# Patient Record
Sex: Female | Born: 1945 | Race: White | Hispanic: No | Marital: Married | Smoking: Never smoker
Health system: Southern US, Community
[De-identification: ages and names within clinical notes are randomized; demographics above are authoritative.]

## PROBLEM LIST (undated history)

## (undated) DIAGNOSIS — E039 Hypothyroidism, unspecified: Secondary | ICD-10-CM

## (undated) DIAGNOSIS — M1990 Unspecified osteoarthritis, unspecified site: Secondary | ICD-10-CM

## (undated) HISTORY — PX: ABDOMINAL HYSTERECTOMY: SHX81

---

## 2009-04-17 ENCOUNTER — Ambulatory Visit: Payer: Self-pay | Admitting: Unknown Physician Specialty

## 2009-04-23 ENCOUNTER — Ambulatory Visit: Payer: Self-pay | Admitting: Unknown Physician Specialty

## 2019-09-09 NOTE — Discharge Instructions (Signed)
Instructions after Total Hip Replacement     Gregroy Dombkowski P. Lashawn Bromwell, Jr., M.D.     Dept. of Orthopaedics & Sports Medicine  [facility]  [address]  [place], [place]  [address]  Phone: [phone]   Fax: [phone]    DIET: . Drink plenty of non-alcoholic fluids. . Resume your normal diet. Include foods high in fiber.  ACTIVITY:  . You may use crutches or a walker with weight-bearing as tolerated, unless instructed otherwise. . You may be weaned off of the walker or crutches by your Physical Therapist.  . Do NOT reach below the level of your knees or cross your legs until allowed.    . Continue doing gentle exercises. Exercising will reduce the pain and swelling, increase motion, and prevent muscle weakness.   . Please continue to use the TED compression stockings for 6 weeks. You may remove the stockings at night, but should reapply them in the morning. . Do not drive or operate any equipment until instructed.  WOUND CARE:  . Continue to use ice packs periodically to reduce pain and swelling. . Keep the incision clean and dry. . You may bathe or shower after the staples are removed at the first office visit following surgery.  MEDICATIONS: . You may resume your regular medications. . Please take the pain medication as prescribed on the medication. . Do not take pain medication on an empty stomach. . You have been given a prescription for a blood thinner to prevent blood clots. Please take the medication as instructed. (NOTE: After completing a 2 week course of Lovenox, take one Enteric-coated aspirin once a day.) . Pain medications and iron supplements can cause constipation. Use a stool softener (Senokot or Colace) on a daily basis and a laxative (dulcolax or miralax) as needed. . Do not drive or drink alcoholic beverages when taking pain medications.  CALL THE OFFICE FOR: . Temperature above 101 degrees . Excessive bleeding or drainage on the dressing. . Excessive  swelling, coldness, or paleness of the toes. . Persistent nausea and vomiting.  FOLLOW-UP:  . You should have an appointment to return to the office in 6 weeks after surgery. . Arrangements have been made for continuation of Physical Therapy (either home therapy or outpatient therapy).     [facility] Department Directory         [email or web address]       [email or web address]          Cardiology  Appointments: [place] - [phone] [place] - [phone]  Endocrinology  Appointments: [place] - [phone] [place] - [phone]  Gastroenterology  Appointments: [place] - [phone] [place] - [phone]        General Surgery   Appointments: [place] - [phone]  Internal Medicine/Family Medicine  Appointments: [place] - [phone] [place] - [phone] [place] - [phone]  Metabolic and Weigh Loss Surgery  Appointments: [place] - [phone]        Neurology  Appointments: [place] - [phone] [place] - [phone]  Neurosurgery  Appointments: [place] - [phone]  Obstetrics & Gynecology  Appointments: [place] - [phone] [place] - [phone]        Pediatrics  Appointments: [place] - [phone] [place] - [phone]  Physiatry  Appointments: [place] -[phone]  Physical Therapy  Appointments: [place] - [phone] [place] - [phone]        Podiatry  Appointments: [place] - [phone] [place] - [phone]  Pulmonology  Appointments: [place] - [phone]  Rheumatology  Appointments:  - [phone]         Location: [facility]  [address] [place], [place]  [address]  [place] Location: [facility] [address] [place], [place]  [address]  [place] Location: [facility] [address] [place], [place]  [address]    

## 2019-09-21 ENCOUNTER — Encounter
Admission: RE | Admit: 2019-09-21 | Discharge: 2019-09-21 | Disposition: A | Discharge status: Home / Self Care | Payer: Medicare Other | Source: Ambulatory Visit | Attending: Orthopedic Surgery | Admitting: Orthopedic Surgery

## 2019-09-21 ENCOUNTER — Encounter: Payer: Self-pay | Admitting: *Deleted

## 2019-09-21 ENCOUNTER — Other Ambulatory Visit: Payer: Self-pay

## 2019-09-21 DIAGNOSIS — Z01818 Encounter for other preprocedural examination: Secondary | ICD-10-CM | POA: Insufficient documentation

## 2019-09-21 DIAGNOSIS — Z01812 Encounter for preprocedural laboratory examination: Secondary | ICD-10-CM | POA: Insufficient documentation

## 2019-09-21 HISTORY — DX: Hypothyroidism, unspecified: E03.9

## 2019-09-21 HISTORY — DX: Unspecified osteoarthritis, unspecified site: M19.90

## 2019-09-21 LAB — COMPREHENSIVE METABOLIC PANEL
ALT: 13 U/L (ref 0–44)
AST: 17 U/L (ref 15–41)
Albumin: 3.9 g/dL (ref 3.5–5.0)
Alkaline Phosphatase: 39 U/L (ref 38–126)
Anion gap: 10 (ref 5–15)
BUN: 23 mg/dL (ref 8–23)
CO2: 30 mmol/L (ref 22–32)
Calcium: 9.3 mg/dL (ref 8.9–10.3)
Chloride: 99 mmol/L (ref 98–111)
Creatinine, Ser: 0.79 mg/dL (ref 0.44–1.00)
GFR calc Af Amer: >60 mL/min (ref >60)
GFR calc non Af Amer: >60 mL/min (ref >60)
Glucose, Bld: 95 mg/dL (ref 70–99)
Potassium: 3.2 mmol/L — ABNORMAL LOW (ref 3.5–5.1)
Sodium: 139 mmol/L (ref 135–145)
Total Bilirubin: 0.5 mg/dL (ref 0.3–1.2)
Total Protein: 7 g/dL (ref 6.5–8.1)

## 2019-09-21 LAB — C-REACTIVE PROTEIN: CRP: 0.5 mg/dL (ref <1.0)

## 2019-09-21 LAB — CBC
HCT: 29.9 % — ABNORMAL LOW (ref 36.0–46.0)
Hemoglobin: 9.8 g/dL — ABNORMAL LOW (ref 12.0–15.0)
MCH: 29.4 pg (ref 26.0–34.0)
MCHC: 32.8 g/dL (ref 30.0–36.0)
MCV: 89.8 fL (ref 80.0–100.0)
Platelets: 259 10*3/uL (ref 150–400)
RBC: 3.33 MIL/uL — ABNORMAL LOW (ref 3.87–5.11)
RDW: 13.3 % (ref 11.5–15.5)
WBC: 5.5 10*3/uL (ref 4.0–10.5)
nRBC: 0 % (ref 0.0–0.2)

## 2019-09-21 LAB — APTT: aPTT: 29 seconds (ref 24–36)

## 2019-09-21 LAB — URINALYSIS, ROUTINE W REFLEX MICROSCOPIC
Bilirubin Urine: NEGATIVE
Glucose, UA: NEGATIVE mg/dL
Ketones, ur: NEGATIVE mg/dL
Nitrite: POSITIVE — AB
Protein, ur: NEGATIVE mg/dL
Specific Gravity, Urine: 1.011 (ref 1.005–1.030)
WBC, UA: >50 WBC/hpf — ABNORMAL HIGH (ref 0–5)
pH: 6 (ref 5.0–8.0)

## 2019-09-21 LAB — PROTIME-INR
INR: 1 (ref 0.8–1.2)
Prothrombin Time: 13 seconds (ref 11.4–15.2)

## 2019-09-21 LAB — TYPE AND SCREEN
ABO/RH(D): A POS
Antibody Screen: NEGATIVE

## 2019-09-21 LAB — SEDIMENTATION RATE: Sed Rate: 28 mm/hr (ref 0–30)

## 2019-09-21 LAB — SURGICAL PCR SCREEN
MRSA, PCR: POSITIVE — AB
Staphylococcus aureus: POSITIVE — AB

## 2019-09-21 NOTE — Pre-Procedure Instructions (Addendum)
EKG from today compared to EKG from 04/12/19 is relatively unremarkable.   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 SINUS TACHYCARDIA POSSIBLE LEFT ATRIAL ENLARGEMENT  INCOMPLETE RIGHT BUNDLE BRANCH BLOCK LEFT ANTERIOR FASCICULAR BLOCK SEPTAL INFARCT  , AGE UNDETERMINED  Confirmed by Eldred Manges ([id number]) on 04/12/2019 7:36:21 AM

## 2019-09-21 NOTE — Patient Instructions (Signed)
Your procedure is scheduled on: Wed. 6/16 Report to Day Surgery. [facility] To find out your arrival time please call [phone] between 1PM - 3PM on Tues 6/15.  Remember: Instructions that are not followed completely may result in serious medical risk,  up to and including death, or upon the discretion of your surgeon and anesthesiologist your  surgery may need to be rescheduled.     _X__ 1. Do not eat food after midnight the night before your procedure.                 No gum chewing or hard candies. You may drink clear liquids up to 2 hours                 before you are scheduled to arrive for your surgery- DO not drink clear                 liquids within 2 hours of the start of your surgery.                 Clear Liquids include:  water, apple juice without pulp, clear Gatorade, G2 or                  Gatorade Zero (avoid Red/Purple/Blue), Black Coffee or Tea (Do not add                 anything to coffee or tea). __x___2.   Complete the carbohydrate drink provided to you, 2 hours before arrival.  __X__2.  On the morning of surgery brush your teeth with toothpaste and water, you                may rinse your mouth with mouthwash if you wish.  Do not swallow any toothpaste of mouthwash.     ___ 3.  No Alcohol for 24 hours before or after surgery.   ___ 4.  Do Not Smoke or use e-cigarettes For 24 Hours Prior to Your Surgery.                 Do not use any chewable tobacco products for at least 6 hours prior to                 Surgery.  __  5.  Do not use any recreational drugs (marijuana, cocaine, heroin, ecstacy, MDMA or other)                For at least one week prior to your surgery.  Combination of these drugs with anesthesia                May have life threatening results.  ____  6.  Bring all medications with you on the day of surgery if instructed.   _x___  7.  Notify your doctor if there is any change in your medical condition      (cold,  fever, infections).     Do not wear jewelry, make-up, hairpins, clips or nail polish. Do not wear lotions, powders, or perfumes. You may wear deodorant. Do not shave 48 hours prior to surgery.  Do not bring valuables to the hospital.    [facility] is not responsible for any belongings or valuables.  Contacts, dentures or bridgework may not be worn into surgery. Leave your suitcase in the car. After surgery it may be brought to your room. For patients admitted to the hospital, discharge time is determined by your treatment team.  Patients discharged the day of surgery will not be allowed to drive home.   Make arrangements for someone to be with you for the first 24 hours of your Same Day Discharge.    Please read over the following fact sheets that you were given:    __x__ Take these medicines the morning of surgery with A SIP OF WATER:    1. levothyroxine (SYNTHROID) 50 MCG tablet  2.   3.   4.  5.  6.  ____ Fleet Enema (as directed)   __x__ Use CHG Soap (or wipes) as directed  ____ Use Benzoyl Peroxide Gel as instructed  ____ Use inhalers on the day of surgery  ____ Stop metformin 2 days prior to surgery    ____ Take 1/2 of usual insulin dose the night before surgery. No insulin the morning          of surgery.   _x___ Stop aspirin  aspirin 81 MG EC tablet on 6/9  _x___ Stop Anti-inflammatories  meloxicam (MOBIC) 7.5 MG tablet ibuprofen and aleve on 6/9    May take tylenol   ____ Stop supplements until after surgery.    ____ Bring C-Pap to the hospital.

## 2019-09-21 NOTE — Pre-Procedure Instructions (Signed)
Pre-Admit Testing Provider Notification Note  Provider Notified: Dr. Hooten  Notification Mode: Fax  Reason: Abnormal lab result.  Response: Fax confirmation received.   Additional Information: Placed on chart. Noted on Pre-Admit Worksheet.  Signed: Aniko Finnigan, RN  

## 2019-09-23 LAB — URINE CULTURE: Culture: >=100000 — AB

## 2019-09-24 NOTE — Pre-Procedure Instructions (Signed)
Pre-Admit Testing Provider Notification Note  Provider Notified: Dr. Ernest Pine  Notification Mode: Fax  Reason: Abnormal lab result. (Urine Culture)  Response: Fax confirmation received.   Additional Information: Placed on chart. Noted on Pre-Admit Worksheet.  Signed: Alvester Morin, RN

## 2019-10-01 ENCOUNTER — Other Ambulatory Visit
Admission: RE | Admit: 2019-10-01 | Discharge: 2019-10-01 | Disposition: A | Discharge status: Home / Self Care | Payer: Medicare Other | Source: Ambulatory Visit | Attending: Orthopedic Surgery | Admitting: Orthopedic Surgery

## 2019-10-01 ENCOUNTER — Other Ambulatory Visit: Payer: Self-pay

## 2019-10-02 LAB — SARS CORONAVIRUS 2 (TAT 6-24 HRS): SARS Coronavirus 2: NEGATIVE

## 2019-10-03 ENCOUNTER — Encounter: Payer: Self-pay | Admitting: Orthopedic Surgery

## 2019-10-03 ENCOUNTER — Inpatient Hospital Stay: Payer: Medicare Other

## 2019-10-03 ENCOUNTER — Inpatient Hospital Stay: Payer: Medicare Other | Admitting: Anesthesiology

## 2019-10-03 ENCOUNTER — Inpatient Hospital Stay
Admission: RE | Admit: 2019-10-03 | Discharge: 2019-10-05 | DRG: 470 | Disposition: A | Discharge status: Home Health | Payer: Medicare Other | Attending: Orthopedic Surgery | Admitting: Orthopedic Surgery

## 2019-10-03 ENCOUNTER — Other Ambulatory Visit: Payer: Self-pay

## 2019-10-03 ENCOUNTER — Encounter
Admission: RE | Disposition: A | Discharge status: Home / Self Care | Payer: Self-pay | Source: Home / Self Care | Attending: Orthopedic Surgery

## 2019-10-03 DIAGNOSIS — Z7983 Long term (current) use of bisphosphonates: Secondary | ICD-10-CM | POA: Diagnosis not present

## 2019-10-03 DIAGNOSIS — M1611 Unilateral primary osteoarthritis, right hip: Secondary | ICD-10-CM | POA: Diagnosis present

## 2019-10-03 DIAGNOSIS — Z96641 Presence of right artificial hip joint: Secondary | ICD-10-CM

## 2019-10-03 DIAGNOSIS — Z79890 Hormone replacement therapy (postmenopausal): Secondary | ICD-10-CM

## 2019-10-03 DIAGNOSIS — Z882 Allergy status to sulfonamides status: Secondary | ICD-10-CM | POA: Diagnosis not present

## 2019-10-03 DIAGNOSIS — Z881 Allergy status to other antibiotic agents status: Secondary | ICD-10-CM | POA: Diagnosis not present

## 2019-10-03 DIAGNOSIS — Z820 Family history of epilepsy and other diseases of the nervous system: Secondary | ICD-10-CM | POA: Diagnosis not present

## 2019-10-03 DIAGNOSIS — Z808 Family history of malignant neoplasm of other organs or systems: Secondary | ICD-10-CM

## 2019-10-03 DIAGNOSIS — Z7982 Long term (current) use of aspirin: Secondary | ICD-10-CM | POA: Diagnosis not present

## 2019-10-03 DIAGNOSIS — Z20822 Contact with and (suspected) exposure to covid-19: Secondary | ICD-10-CM | POA: Diagnosis present

## 2019-10-03 DIAGNOSIS — E039 Hypothyroidism, unspecified: Secondary | ICD-10-CM | POA: Diagnosis present

## 2019-10-03 DIAGNOSIS — Z791 Long term (current) use of non-steroidal anti-inflammatories (NSAID): Secondary | ICD-10-CM | POA: Diagnosis not present

## 2019-10-03 DIAGNOSIS — Z79899 Other long term (current) drug therapy: Secondary | ICD-10-CM

## 2019-10-03 DIAGNOSIS — Z96649 Presence of unspecified artificial hip joint: Secondary | ICD-10-CM

## 2019-10-03 HISTORY — PX: TOTAL HIP ARTHROPLASTY: SHX124

## 2019-10-03 LAB — ABO/RH: ABO/RH(D): A POS

## 2019-10-03 SURGERY — ARTHROPLASTY, HIP, TOTAL,POSTERIOR APPROACH
Anesthesia: Choice | Site: Hip | Laterality: Right

## 2019-10-03 MED ORDER — OXYCODONE HCL 5 MG PO TABS: 5.0000 mg | ORAL_TABLET | Freq: Once | ORAL | Status: DC | PRN

## 2019-10-03 MED ORDER — SODIUM CHLORIDE 0.9 % IV SOLN: INTRAVENOUS | Status: DC

## 2019-10-03 MED ORDER — ONDANSETRON HCL 4 MG PO TABS: 4.0000 mg | ORAL_TABLET | Freq: Four times a day (QID) | ORAL | Status: DC | PRN

## 2019-10-03 MED ORDER — OXYCODONE HCL 5 MG PO TABS: 10.0000 mg | ORAL_TABLET | ORAL | Status: DC | PRN

## 2019-10-03 MED ORDER — CELECOXIB 200 MG PO CAPS
400.0000 mg | ORAL_CAPSULE | Freq: Once | ORAL | Status: AC
  Administered 2019-10-03: 400 mg via ORAL

## 2019-10-03 MED ORDER — ONDANSETRON HCL 4 MG/2ML IJ SOLN: 4.0000 mg | Freq: Once | INTRAMUSCULAR | Status: DC | PRN

## 2019-10-03 MED ORDER — CEFAZOLIN SODIUM-DEXTROSE 2-4 GM/100ML-% IV SOLN
2.0000 g | Freq: Four times a day (QID) | INTRAVENOUS | Status: AC
  Administered 2019-10-03 – 2019-10-04 (×4): 2 g via INTRAVENOUS
  Filled 2019-10-03 (×4): qty 100

## 2019-10-03 MED ORDER — ENOXAPARIN SODIUM 30 MG/0.3ML ~~LOC~~ SOLN
30.0000 mg | Freq: Two times a day (BID) | SUBCUTANEOUS | Status: DC
  Administered 2019-10-04 – 2019-10-05 (×3): 30 mg via SUBCUTANEOUS
  Filled 2019-10-03 (×3): qty 0.3

## 2019-10-03 MED ORDER — BUPIVACAINE HCL (PF) 0.25 % IJ SOLN
INTRAMUSCULAR | Status: AC
  Filled 2019-10-03: qty 60

## 2019-10-03 MED ORDER — BUPIVACAINE HCL (PF) 0.5 % IJ SOLN
INTRAMUSCULAR | Status: DC | PRN
  Administered 2019-10-03: 2.6 mL

## 2019-10-03 MED ORDER — TRANEXAMIC ACID-NACL 1000-0.7 MG/100ML-% IV SOLN
1000.0000 mg | INTRAVENOUS | Status: AC
  Administered 2019-10-03: 1000 mg via INTRAVENOUS

## 2019-10-03 MED ORDER — GABAPENTIN 300 MG PO CAPS
300.0000 mg | ORAL_CAPSULE | Freq: Every day | ORAL | Status: DC
  Administered 2019-10-03 – 2019-10-04 (×2): 300 mg via ORAL
  Filled 2019-10-03 (×2): qty 1

## 2019-10-03 MED ORDER — OXYCODONE HCL 5 MG PO TABS
5.0000 mg | ORAL_TABLET | ORAL | Status: DC | PRN
  Administered 2019-10-03 – 2019-10-05 (×7): 5 mg via ORAL
  Filled 2019-10-03 (×7): qty 1

## 2019-10-03 MED ORDER — LEVOTHYROXINE SODIUM 50 MCG PO TABS
50.0000 ug | ORAL_TABLET | Freq: Every day | ORAL | Status: DC
  Administered 2019-10-04 – 2019-10-05 (×2): 50 ug via ORAL
  Filled 2019-10-03 (×2): qty 1

## 2019-10-03 MED ORDER — FLEET ENEMA 7-19 GM/118ML RE ENEM: 1.0000 | ENEMA | Freq: Once | RECTAL | Status: DC | PRN

## 2019-10-03 MED ORDER — FAMOTIDINE 20 MG PO TABS
20.0000 mg | ORAL_TABLET | Freq: Once | ORAL | Status: AC
  Administered 2019-10-03: 20 mg via ORAL

## 2019-10-03 MED ORDER — CELECOXIB 200 MG PO CAPS
200.0000 mg | ORAL_CAPSULE | Freq: Two times a day (BID) | ORAL | Status: DC
  Administered 2019-10-03 – 2019-10-05 (×4): 200 mg via ORAL
  Filled 2019-10-03 (×4): qty 1

## 2019-10-03 MED ORDER — VANCOMYCIN HCL IN DEXTROSE 1-5 GM/200ML-% IV SOLN: 1000.0000 mg | Freq: Once | INTRAVENOUS | Status: DC

## 2019-10-03 MED ORDER — CALCIUM CARBONATE 1250 (500 CA) MG PO TABS
1.0000 | ORAL_TABLET | Freq: Every day | ORAL | Status: DC
  Administered 2019-10-03 – 2019-10-04 (×2): 500 mg via ORAL
  Filled 2019-10-03 (×2): qty 1

## 2019-10-03 MED ORDER — ONDANSETRON HCL 4 MG/2ML IJ SOLN: 4.0000 mg | Freq: Four times a day (QID) | INTRAMUSCULAR | Status: DC | PRN

## 2019-10-03 MED ORDER — TRANEXAMIC ACID-NACL 1000-0.7 MG/100ML-% IV SOLN
1000.0000 mg | Freq: Once | INTRAVENOUS | Status: AC
  Administered 2019-10-03: 1000 mg via INTRAVENOUS

## 2019-10-03 MED ORDER — LATANOPROST 0.005 % OP SOLN
1.0000 [drp] | Freq: Every day | OPHTHALMIC | Status: DC
  Administered 2019-10-03 – 2019-10-05 (×2): 1 [drp] via OPHTHALMIC
  Filled 2019-10-03 (×2): qty 2.5

## 2019-10-03 MED ORDER — CHLORHEXIDINE GLUCONATE 0.12 % MT SOLN
OROMUCOSAL | Status: AC
  Filled 2019-10-03: qty 15

## 2019-10-03 MED ORDER — HYDROMORPHONE HCL 1 MG/ML IJ SOLN: 0.5000 mg | INTRAMUSCULAR | Status: DC | PRN

## 2019-10-03 MED ORDER — DIPHENHYDRAMINE HCL 12.5 MG/5ML PO ELIX: 12.5000 mg | ORAL_SOLUTION | ORAL | Status: DC | PRN

## 2019-10-03 MED ORDER — ALUM & MAG HYDROXIDE-SIMETH 200-200-20 MG/5ML PO SUSP: 30.0000 mL | ORAL | Status: DC | PRN

## 2019-10-03 MED ORDER — ACETAMINOPHEN 10 MG/ML IV SOLN
1000.0000 mg | Freq: Four times a day (QID) | INTRAVENOUS | Status: AC
  Administered 2019-10-03 – 2019-10-04 (×4): 1000 mg via INTRAVENOUS
  Filled 2019-10-03 (×4): qty 100

## 2019-10-03 MED ORDER — METOCLOPRAMIDE HCL 10 MG PO TABS
5.0000 mg | ORAL_TABLET | Freq: Three times a day (TID) | ORAL | Status: DC | PRN
  Administered 2019-10-05: 10 mg via ORAL

## 2019-10-03 MED ORDER — DEXAMETHASONE SODIUM PHOSPHATE 10 MG/ML IJ SOLN
8.0000 mg | Freq: Once | INTRAMUSCULAR | Status: AC
  Administered 2019-10-03: 8 mg via INTRAVENOUS

## 2019-10-03 MED ORDER — GABAPENTIN 300 MG PO CAPS
300.0000 mg | ORAL_CAPSULE | Freq: Once | ORAL | Status: AC
  Administered 2019-10-03: 300 mg via ORAL

## 2019-10-03 MED ORDER — FENTANYL CITRATE (PF) 100 MCG/2ML IJ SOLN
INTRAMUSCULAR | Status: AC
  Filled 2019-10-03: qty 2

## 2019-10-03 MED ORDER — METOCLOPRAMIDE HCL 10 MG PO TABS
10.0000 mg | ORAL_TABLET | Freq: Three times a day (TID) | ORAL | Status: AC
  Administered 2019-10-03 – 2019-10-05 (×7): 10 mg via ORAL
  Filled 2019-10-03 (×7): qty 1

## 2019-10-03 MED ORDER — ACETAMINOPHEN 325 MG PO TABS: 325.0000 mg | ORAL_TABLET | Freq: Four times a day (QID) | ORAL | Status: DC | PRN

## 2019-10-03 MED ORDER — PROPOFOL 500 MG/50ML IV EMUL
INTRAVENOUS | Status: AC
  Filled 2019-10-03: qty 50

## 2019-10-03 MED ORDER — METOCLOPRAMIDE HCL 5 MG/ML IJ SOLN: 5.0000 mg | Freq: Three times a day (TID) | INTRAMUSCULAR | Status: DC | PRN

## 2019-10-03 MED ORDER — ORAL CARE MOUTH RINSE: 15.0000 mL | Freq: Once | OROMUCOSAL | Status: DC

## 2019-10-03 MED ORDER — MENTHOL 3 MG MT LOZG
1.0000 | LOZENGE | OROMUCOSAL | Status: DC | PRN
  Filled 2019-10-03: qty 9

## 2019-10-03 MED ORDER — ACETAMINOPHEN 10 MG/ML IV SOLN
INTRAVENOUS | Status: DC | PRN
  Administered 2019-10-03: 500 mg via INTRAVENOUS

## 2019-10-03 MED ORDER — CEFAZOLIN SODIUM-DEXTROSE 2-4 GM/100ML-% IV SOLN
2.0000 g | INTRAVENOUS | Status: AC
  Administered 2019-10-03 (×2): 1 g via INTRAVENOUS

## 2019-10-03 MED ORDER — BUPIVACAINE LIPOSOME 1.3 % IJ SUSP
INTRAMUSCULAR | Status: AC
  Filled 2019-10-03: qty 20

## 2019-10-03 MED ORDER — PROPOFOL 500 MG/50ML IV EMUL
INTRAVENOUS | Status: DC | PRN
  Administered 2019-10-03: 50 ug/kg/min via INTRAVENOUS

## 2019-10-03 MED ORDER — TRANEXAMIC ACID-NACL 1000-0.7 MG/100ML-% IV SOLN
INTRAVENOUS | Status: AC
  Filled 2019-10-03: qty 100

## 2019-10-03 MED ORDER — FENTANYL CITRATE (PF) 100 MCG/2ML IJ SOLN
25.0000 ug | INTRAMUSCULAR | Status: DC | PRN
  Administered 2019-10-03 (×2): 50 ug via INTRAVENOUS

## 2019-10-03 MED ORDER — ENSURE PRE-SURGERY PO LIQD
296.0000 mL | Freq: Once | ORAL | Status: DC
  Filled 2019-10-03: qty 296

## 2019-10-03 MED ORDER — ACETAMINOPHEN 10 MG/ML IV SOLN: 1000.0000 mg | Freq: Four times a day (QID) | INTRAVENOUS | Status: DC

## 2019-10-03 MED ORDER — SODIUM CHLORIDE FLUSH 0.9 % IV SOLN
INTRAVENOUS | Status: AC
  Filled 2019-10-03: qty 10

## 2019-10-03 MED ORDER — SODIUM CHLORIDE 1 G PO TABS
1.0000 g | ORAL_TABLET | Freq: Three times a day (TID) | ORAL | Status: DC
  Administered 2019-10-04 – 2019-10-05 (×4): 1 g via ORAL
  Filled 2019-10-03 (×8): qty 1

## 2019-10-03 MED ORDER — CHLORHEXIDINE GLUCONATE 0.12 % MT SOLN: 15.0000 mL | Freq: Once | OROMUCOSAL | Status: DC

## 2019-10-03 MED ORDER — BISACODYL 10 MG RE SUPP
10.0000 mg | Freq: Every day | RECTAL | Status: DC | PRN
  Administered 2019-10-05: 10 mg via RECTAL
  Filled 2019-10-03: qty 1

## 2019-10-03 MED ORDER — ALENDRONATE SODIUM 70 MG PO TABS: 70.0000 mg | ORAL_TABLET | ORAL | Status: DC

## 2019-10-03 MED ORDER — SENNOSIDES-DOCUSATE SODIUM 8.6-50 MG PO TABS
1.0000 | ORAL_TABLET | Freq: Two times a day (BID) | ORAL | Status: DC
  Administered 2019-10-03 – 2019-10-05 (×4): 1 via ORAL
  Filled 2019-10-03 (×4): qty 1

## 2019-10-03 MED ORDER — MIDAZOLAM HCL 5 MG/5ML IJ SOLN
INTRAMUSCULAR | Status: DC | PRN
  Administered 2019-10-03 (×2): .5 mg via INTRAVENOUS
  Administered 2019-10-03: 1 mg via INTRAVENOUS

## 2019-10-03 MED ORDER — LACTATED RINGERS IV SOLN: INTRAVENOUS | Status: DC

## 2019-10-03 MED ORDER — NEOMYCIN-POLYMYXIN B GU 40-200000 IR SOLN
Status: DC | PRN
  Administered 2019-10-03: 14 mL

## 2019-10-03 MED ORDER — VITAMIN D 25 MCG (1000 UNIT) PO TABS
1000.0000 [IU] | ORAL_TABLET | Freq: Every day | ORAL | Status: DC
  Administered 2019-10-03 – 2019-10-04 (×2): 1000 [IU] via ORAL
  Filled 2019-10-03 (×2): qty 1

## 2019-10-03 MED ORDER — PHENOL 1.4 % MT LIQD
1.0000 | OROMUCOSAL | Status: DC | PRN
  Filled 2019-10-03: qty 177

## 2019-10-03 MED ORDER — MAGNESIUM HYDROXIDE 400 MG/5ML PO SUSP
30.0000 mL | Freq: Every day | ORAL | Status: DC
  Administered 2019-10-04 – 2019-10-05 (×2): 30 mL via ORAL
  Filled 2019-10-03 (×2): qty 30

## 2019-10-03 MED ORDER — OXYCODONE HCL 5 MG/5ML PO SOLN: 5.0000 mg | Freq: Once | ORAL | Status: DC | PRN

## 2019-10-03 MED ORDER — ACETAMINOPHEN 10 MG/ML IV SOLN
INTRAVENOUS | Status: AC
  Filled 2019-10-03: qty 100

## 2019-10-03 MED ORDER — TRAMADOL HCL 50 MG PO TABS: 50.0000 mg | ORAL_TABLET | ORAL | Status: DC | PRN

## 2019-10-03 MED ORDER — ONDANSETRON HCL 4 MG/2ML IJ SOLN
INTRAMUSCULAR | Status: DC | PRN
  Administered 2019-10-03: 4 mg via INTRAVENOUS

## 2019-10-03 MED ORDER — NEOMYCIN-POLYMYXIN B GU 40-200000 IR SOLN
Status: AC
  Filled 2019-10-03: qty 40

## 2019-10-03 MED ORDER — MIDAZOLAM HCL 2 MG/2ML IJ SOLN
INTRAMUSCULAR | Status: AC
  Filled 2019-10-03: qty 2

## 2019-10-03 MED ORDER — SODIUM CHLORIDE 0.9 % IV SOLN
INTRAVENOUS | Status: DC | PRN
  Administered 2019-10-03: 50 ug/min via INTRAVENOUS

## 2019-10-03 MED ORDER — PANTOPRAZOLE SODIUM 40 MG PO TBEC
40.0000 mg | DELAYED_RELEASE_TABLET | Freq: Two times a day (BID) | ORAL | Status: DC
  Administered 2019-10-03 – 2019-10-05 (×4): 40 mg via ORAL
  Filled 2019-10-03 (×4): qty 1

## 2019-10-03 MED ORDER — CHLORHEXIDINE GLUCONATE 4 % EX LIQD: 60.0000 mL | Freq: Once | CUTANEOUS | Status: DC

## 2019-10-03 MED ORDER — FERROUS SULFATE 325 (65 FE) MG PO TABS
325.0000 mg | ORAL_TABLET | Freq: Two times a day (BID) | ORAL | Status: DC
  Administered 2019-10-04 – 2019-10-05 (×3): 325 mg via ORAL
  Filled 2019-10-03 (×3): qty 1

## 2019-10-03 SURGICAL SUPPLY — 65 items
BLADE DRUM FLTD (BLADE) ×3 IMPLANT
BLADE SAW 90X25X1.19 OSCILLAT (BLADE) ×3 IMPLANT
CANISTER SUCT 1200ML W/VALVE (MISCELLANEOUS) ×3 IMPLANT
CANISTER SUCT 3000ML PPV (MISCELLANEOUS) ×6 IMPLANT
CARTRIDGE OIL MAESTRO DRILL (MISCELLANEOUS) ×1 IMPLANT
COVER WAND RF STERILE (DRAPES) ×3 IMPLANT
CUP ACET PINNACLE SECTR 48MM (Joint) IMPLANT
DIFFUSER DRILL AIR PNEUMATIC (MISCELLANEOUS) ×3 IMPLANT
DRAPE 3/4 80X56 (DRAPES) ×3 IMPLANT
DRAPE INCISE IOBAN 66X60 STRL (DRAPES) ×3 IMPLANT
DRSG DERMACEA 8X12 NADH (GAUZE/BANDAGES/DRESSINGS) ×3 IMPLANT
DRSG OPSITE POSTOP 4X12 (GAUZE/BANDAGES/DRESSINGS) ×1 IMPLANT
DRSG OPSITE POSTOP 4X14 (GAUZE/BANDAGES/DRESSINGS) ×2 IMPLANT
DRSG TEGADERM 4X4.75 (GAUZE/BANDAGES/DRESSINGS) ×3 IMPLANT
DURAPREP 26ML APPLICATOR (WOUND CARE) ×3 IMPLANT
ELECT REM PT RETURN 9FT ADLT (ELECTROSURGICAL) ×3
ELECTRODE REM PT RTRN 9FT ADLT (ELECTROSURGICAL) ×1 IMPLANT
GLOVE BIO SURGEON STRL SZ 6.5 (GLOVE) ×1 IMPLANT
GLOVE BIO SURGEON STRL SZ7.5 (GLOVE) ×6 IMPLANT
GLOVE BIO SURGEONS STRL SZ 6.5 (GLOVE) ×1
GLOVE BIOGEL M STRL SZ7.5 (GLOVE) ×6 IMPLANT
GLOVE BIOGEL PI IND STRL 7.0 (GLOVE) IMPLANT
GLOVE BIOGEL PI IND STRL 7.5 (GLOVE) ×1 IMPLANT
GLOVE BIOGEL PI INDICATOR 7.0 (GLOVE) ×2
GLOVE BIOGEL PI INDICATOR 7.5 (GLOVE) ×2
GLOVE INDICATOR 8.0 STRL GRN (GLOVE) ×3 IMPLANT
GOWN STRL REUS W/ TWL LRG LVL3 (GOWN DISPOSABLE) ×2 IMPLANT
GOWN STRL REUS W/ TWL XL LVL3 (GOWN DISPOSABLE) ×1 IMPLANT
GOWN STRL REUS W/TWL LRG LVL3 (GOWN DISPOSABLE) ×6
GOWN STRL REUS W/TWL XL LVL3 (GOWN DISPOSABLE) ×2
HEAD FEM STD 32X+1 STRL (Hips) ×2 IMPLANT
HEMOVAC 400CC 10FR (MISCELLANEOUS) ×3 IMPLANT
HOLDER FOLEY CATH W/STRAP (MISCELLANEOUS) ×3 IMPLANT
HOOD PEEL AWAY FLYTE STAYCOOL (MISCELLANEOUS) ×6 IMPLANT
KIT TURNOVER KIT A (KITS) ×3 IMPLANT
LINER ACET 32X48 (Liner) ×2 IMPLANT
MANIFOLD NEPTUNE II (INSTRUMENTS) ×3 IMPLANT
NDL SAFETY ECLIPSE 18X1.5 (NEEDLE) ×1 IMPLANT
NEEDLE HYPO 18GX1.5 SHARP (NEEDLE) ×2
NS IRRIG 500ML POUR BTL (IV SOLUTION) ×3 IMPLANT
OIL CARTRIDGE MAESTRO DRILL (MISCELLANEOUS) ×3
PACK HIP PROSTHESIS (MISCELLANEOUS) ×3 IMPLANT
PENCIL SMOKE ULTRAEVAC 22 CON (MISCELLANEOUS) ×3 IMPLANT
PIN STEIN THRED 5/32 (Pin) ×3 IMPLANT
PINNSECTOR W/GRIP ACE CUP 48MM (Joint) ×3 IMPLANT
PULSAVAC PLUS IRRIG FAN TIP (DISPOSABLE) ×3
SCREW 6.5MMX25MM (Screw) ×2 IMPLANT
SOL .9 NS 3000ML IRR  AL (IV SOLUTION) ×2
SOL .9 NS 3000ML IRR UROMATIC (IV SOLUTION) ×1 IMPLANT
SOL PREP PVP 2OZ (MISCELLANEOUS) ×3
SOLUTION PREP PVP 2OZ (MISCELLANEOUS) ×1 IMPLANT
SPONGE DRAIN TRACH 4X4 STRL 2S (GAUZE/BANDAGES/DRESSINGS) ×3 IMPLANT
STAPLER SKIN PROX 35W (STAPLE) ×3 IMPLANT
STEM AML 12X155X30 STD SM 6IN (Joint) ×2 IMPLANT
SUT ETHIBOND #5 BRAIDED 30INL (SUTURE) ×3 IMPLANT
SUT VIC AB 0 CT1 36 (SUTURE) ×3 IMPLANT
SUT VIC AB 1 CT1 36 (SUTURE) ×6 IMPLANT
SUT VIC AB 2-0 CT1 27 (SUTURE) ×2
SUT VIC AB 2-0 CT1 TAPERPNT 27 (SUTURE) ×1 IMPLANT
SYR 20ML LL LF (SYRINGE) ×3 IMPLANT
TAPE CLOTH 3X10 WHT NS LF (GAUZE/BANDAGES/DRESSINGS) ×3 IMPLANT
TAPE TRANSPORE STRL 2 31045 (GAUZE/BANDAGES/DRESSINGS) ×3 IMPLANT
TIP FAN IRRIG PULSAVAC PLUS (DISPOSABLE) ×1 IMPLANT
TOWEL OR 17X26 4PK STRL BLUE (TOWEL DISPOSABLE) ×3 IMPLANT
TRAY FOLEY MTR SLVR 16FR STAT (SET/KITS/TRAYS/PACK) ×3 IMPLANT

## 2019-10-03 NOTE — Anesthesia Preprocedure Evaluation (Signed)
Anesthesia Evaluation  Patient identified by MRN, date of birth, ID band Patient awake    Reviewed: Allergy & Precautions, NPO status , Patient's Chart, lab work & pertinent test results  History of Anesthesia Complications Negative for: history of anesthetic complications  Airway Mallampati: II  TM Distance: >3 FB Neck ROM: Full    Dental no notable dental hx. (+) Teeth Intact   Pulmonary neg pulmonary ROS, neg sleep apnea, neg COPD, Patient abstained from smoking.Not current smoker,    Pulmonary exam normal breath sounds clear to auscultation       Cardiovascular Exercise Tolerance: Good METS(-) hypertension(-) CAD and (-) Past MI negative cardio ROS  (-) dysrhythmias  Rhythm:Regular Rate:Normal - Systolic murmurs    Neuro/Psych negative neurological ROS  negative psych ROS   GI/Hepatic neg GERD  ,(+)     (-) substance abuse  ,   Endo/Other  neg diabetesHypothyroidism   Renal/GU negative Renal ROS     Musculoskeletal  (+) Arthritis , Osteoarthritis,    Abdominal   Peds  Hematology   Anesthesia Other Findings Past Medical History: No date: Arthritis No date: Hypothyroidism  Reproductive/Obstetrics                             Anesthesia Physical Anesthesia Plan  ASA: II  Anesthesia Plan: General/Spinal   Post-op Pain Management:    Induction: Intravenous  PONV Risk Score and Plan: 4 or greater and Ondansetron, Dexamethasone, Propofol infusion, TIVA, Midazolam and Treatment may vary due to age or medical condition  Airway Management Planned: Natural Airway and Nasal Cannula  Additional Equipment: None  Intra-op Plan:   Post-operative Plan:   Informed Consent: I have reviewed the patients History and Physical, chart, labs and discussed the procedure including the risks, benefits and alternatives for the proposed anesthesia with the patient or authorized representative who  has indicated his/her understanding and acceptance.       Plan Discussed with: CRNA and Surgeon  Anesthesia Plan Comments: (Discussed R/B/A of neuraxial anesthesia technique with patient: - rare risks of spinal/epidural hematoma, nerve damage, infection - Risk of PDPH - Risk of nausea and vomiting - Risk of conversion to general anesthesia and its associated risks, including sore throat, damage to lips/teeth/oropharynx, and rare risks such as cardiac and respiratory events.  Patient voiced understanding.)        Anesthesia Quick Evaluation

## 2019-10-03 NOTE — Progress Notes (Signed)
PHARMACIST - PHYSICIAN COMMUNICATION  CONCERNING: P&T Medication Policy Regarding Oral Bisphosphonates  RECOMMENDATION: Your order for alendronate (Fosamax) has been discontinued at this time.  If the patient's post-hospital medical condition warrants safe use of this class of drugs, please resume the pre-hospital regimen upon discharge.  DESCRIPTION:  Alendronate (Fosamax), ibandronate (Boniva), and risedronate (Actonel) can cause severe esophageal erosions in patients who are unable to remain upright at least 30 minutes after taking this medication.   Since brief interruptions in therapy are thought to have minimal impact on bone mineral density, the Pharmacy & Therapeutics Committee has established that bisphosphonate orders should be routinely discontinued during hospitalization.   To override this safety policy and permit administration of Boniva, Fosamax, or Actonel in the hospital, prescribers must write "DO NOT HOLD" in the comments section when placing the order for this class of medications.  Bari Mantis PharmD Clinical Pharmacist 10/03/2019

## 2019-10-03 NOTE — Anesthesia Postprocedure Evaluation (Signed)
Anesthesia Post Note  Patient: Sarah Shields  Procedure(s) Performed: TOTAL HIP ARTHROPLASTY (Right Hip)  Patient location during evaluation: PACU Anesthesia Type: Combined General/Spinal Level of consciousness: oriented and awake and alert Pain management: pain level controlled Vital Signs Assessment: post-procedure vital signs reviewed and stable Respiratory status: spontaneous breathing, respiratory function stable and patient connected to nasal cannula oxygen Cardiovascular status: blood pressure returned to baseline and stable Postop Assessment: no headache, no backache, no apparent nausea or vomiting and spinal receding Anesthetic complications: no   No complications documented.   Last Vitals:  Vitals:   10/03/19 1257 10/03/19 1300  BP:  133/84  Pulse: 70 78  Resp: 14 12  Temp:    SpO2: 99% 97%    Last Pain:  Vitals:   10/03/19 1300  TempSrc:   PainSc: 2                  Corinda Gubler

## 2019-10-03 NOTE — Anesthesia Procedure Notes (Signed)
Spinal  Patient location during procedure: OR Start time: 10/03/2019 8:35 AM End time: 10/03/2019 8:39 AM Staffing Performed: resident/CRNA  Resident/CRNA: Nelda Marseille, CRNA Preanesthetic Checklist Completed: patient identified, IV checked, site marked, risks and benefits discussed, surgical consent, monitors and equipment checked, pre-op evaluation and timeout performed Spinal Block Patient position: sitting Prep: Betadine Patient monitoring: heart rate, continuous pulse ox, blood pressure and cardiac monitor Approach: midline Location: L3-4 Injection technique: single-shot Needle Needle type: Whitacre and Introducer  Needle gauge: 25 G Needle length: 9 cm Assessment Sensory level: T10 Additional Notes Negative paresthesia. Negative blood return. Positive free-flowing CSF. Expiration date of kit checked and confirmed. Patient tolerated procedure well, without complications. Pt with moderate scoliosis.  No apparent distress or complications

## 2019-10-03 NOTE — H&P (Signed)
ORTHOPAEDIC HISTORY & PHYSICAL Progress Notes Michelene Gardener, [place] - 09/21/2019 2:45 PM EDT [facility] Chief Complaint:   Chief Complaint  Patient presents with  . Hip Pain  H & P RIGHT HIP   History of Present Illness:   Sarah Shields is a 74 y.o. female that presents to clinic today for her preoperative history and evaluation. Patient presents unaccompanied. The patient is scheduled to undergo a right total hip arthroplasty on 10/03/19 by Dr. Ernest Pine. Patient reports a long history of right hip and groin pain. She describes her pain as worse with weightbearing. She reports associated decrease of range of right hip motion. She denies associated numbness or tingling.   The patient's symptoms have progressed to the point that they decrease her quality of life. The patient has previously undergone conservative treatment including NSAIDS and activity modification without adequate control of her symptoms.  Denies history of blood clots, cardiac history, or lumbar surgery.   Past Medical, Surgical, Family, Social History, Allergies, Medications:   Past Medical History:  Past Medical History:  Diagnosis Date  . Arthritis  . Hypothyroidism   Past Surgical History:  Past Surgical History:  Procedure Laterality Date  . HYSTERECTOMY VAGINAL 2011   Current Medications:  Current Outpatient Medications  Medication Sig Dispense Refill  . cholecalciferol, vitamin D3, (VITAMIN D3) 125 mcg (5,000 unit) tablet Take 5,000 Units by mouth once daily  . docusate calcium (SURFAK) 240 mg capsule Take 240 mg by mouth 2 (two) times daily  . ibuprofen (MOTRIN) 200 MG tablet Take 400 mg by mouth every 8 (eight) hours as needed for Pain  . alendronate (FOSAMAX) 70 mg/75 mL solution Take 70 mg by mouth every 7 (seven) days Take in the morning with a full glass of water, on an empty stomach, and do not take anything else by mouth or lie down for the next  30 min.  Marland Kitchen aspirin 81 MG EC tablet Take 81 mg by mouth once daily  . calcium carbonate-vitamin D3 (CALTRATE 600+D) 600 mg(1,500mg ) -200 unit tablet Take 1 tablet by mouth once daily  . cephalexin (KEFLEX) 500 MG capsule Take 1 capsule (500 mg total) by mouth 4 (four) times daily for 5 days Pt encouraged to also take otc probiotic while taking antibiotic. 20 capsule 0  . latanoprost (XALATAN) 0.005 % ophthalmic solution Place 1 drop into both eyes nightly  . levothyroxine (SYNTHROID) 50 MCG tablet Take 50 mcg by mouth every morning before breakfast  . meloxicam (MOBIC) 7.5 MG tablet Take 1 tablet by mouth once daily  . sodium chloride 1 gram tablet Take 1 tablet by mouth 3 (three) times daily   No current facility-administered medications for this visit.   Allergies:  Allergies  Allergen Reactions  . Cefdinir Itching and Rash  . Cephalexin Diarrhea  . Sulfa (Sulfonamide Antibiotics) Rash   Social History:  Social History   Socioeconomic History  . Marital status: Married  Spouse name: Dorene Sorrow  . Number of children: 2  . Years of education: 62  . Highest education level: High school graduate  Occupational History  . Occupation: Retired  Tobacco Use  . Smoking status: Never Smoker  . Smokeless tobacco: Never Used  Substance and Sexual Activity  . Alcohol use: Never  . Drug use: Never  . Sexual activity: Defer  Partners: Male  Other Topics Concern  . Not on file  Social History Narrative  . Not on file  Social Determinants of Health   Financial Resource Strain:  . Difficulty of Paying Living Expenses:  Food Insecurity:  . Worried About Charity fundraiser in the Last Year:  . Arboriculturist in the Last Year:  Transportation Needs:  . Film/video editor (Medical):  Marland Kitchen Lack of Transportation (Non-Medical):  Physical Activity:  . Days of Exercise per Week:  . Minutes of Exercise per Session:  Stress:  . Feeling of Stress :  Social Connections:  . Frequency of  Communication with Friends and Family:  . Frequency of Social Gatherings with Friends and Family:  . Attends Religious Services:  . Active Member of Clubs or Organizations:  . Attends Archivist Meetings:  Marland Kitchen Marital Status:   Family History:  Family History  Problem Relation Age of Onset  . Skin cancer Mother  . Dementia Mother   Review of Systems:   A 10+ ROS was performed, reviewed, and the pertinent orthopaedic findings are documented in the HPI.   Physical Examination:   BP 130/70  Ht 147.3 cm (4\' 10" )  Wt (!) 44.3 kg (97 lb 9.6 oz)  BMI 20.40 kg/m   Patient is a well-developed, well-nourished female in no acute distress. Patient has normal mood and affect. Patient is alert and oriented to person, place, and time.   HEENT: Atraumatic, normocephalic. Pupils equal and reactive to light. Extraocular motion intact. Noninjected sclera.  Cardiovascular: Regular rate and rhythm, with no murmurs, rubs, or gallops. Distal pulses palpable.  Respiratory: Lungs clear to auscultation bilaterally.   Right Hip: Soft tissue swelling: Negative Erythema: Negative Crepitance: Positive Tenderness: Greater trochanter is nontender to palpation. Moderate pain is elicited by axial compression or extremes of rotation. Atrophy: No atrophy. Fair to good hip flexor and abductor strength. Range of Motion: EXT/FLEX: 0/0/100 ADD/ABD: 20/0/20 IR/ER: 20/0/30  Sensation is intact over the saphenous, lateral cutaneous, superficial fibular, and deep fibular nerve distributions.  Tests Performed/Reviewed:  X-rays  Anteroposterior view of the pelvis as well as anteroposterior and lateral views of the right hip were obtained. Images reveal complete loss of femoral acetabular joint space with subchondral sclerosis of the acetabulum noted. Deformation of the femoral head is noted. No fractures noted.  Impression:   ICD-10-CM  1. Primary osteoarthritis of right hip M16.11   Plan:   The  patient has end-stage degenerative changes of the right hip. It was explained to the patient that the condition is progressive in nature. Having failed conservative treatment, the patient has elected to proceed with a total joint arthroplasty. The patient will undergo a total joint arthroplasty with Dr. Marry Guan. The risks of surgery, including blood clot and infection, were discussed with the patient. Measures to reduce these risks, including the use of anticoagulation, perioperative antibiotics, and early ambulation were discussed. The importance of postoperative physical therapy was discussed with the patient. The patient elects to proceed with surgery. The patient is instructed to stop all blood thinners prior to surgery. The patient is instructed to call the hospital the day before surgery to learn of the proper arrival time.   Contact our office with any questions or concerns. Follow up as indicated, or sooner should any new problems arise, if conditions worsen, or if they are otherwise concerned.   Gwenlyn Fudge, PA-C [facility] [place], [place] [address] Phone: [phone]  This note was generated in part with voice recognition software and I apologize for any typographical errors that  were not detected and corrected.   Electronically signed by Michelene Gardener, PA at 09/27/2019 9:25 PM EDT

## 2019-10-03 NOTE — Evaluation (Signed)
Physical Therapy Evaluation Patient Details Name: Sarah Shields MRN: [id number] DOB: 02-24-1946 Today's Date: 10/03/2019   History of Present Illness  Pt is POD 0 of R THR. History of arthritis.  Clinical Impression  Pt is a pleasant 74 year old female who was admitted for R THR. Pt performs bed mobility, transfers, and ambulation with min assist and RW. Cued for hip precautions/WB status prior to ambulation, however needed several reminders throughout session. Pt very flat and slow to respond. Appears to have good social support for home discharge. Will trial youth size RW tomorrow to determine appropriate fit. Pt demonstrates deficits with strength/mobility/pain. Pt very hesitant to perform therapy this date, education provided on successful outcomes. Would benefit from skilled PT to address above deficits and promote optimal return to PLOF. Recommend transition to [place] upon discharge from acute hospitalization.     Follow Up Recommendations Home health PT;Supervision/Assistance - 24 hour    Equipment Recommendations   (may need youth RW, will continue to assess)    Recommendations for Other Services       Precautions / Restrictions Precautions Precautions: Posterior Hip;Fall Precaution Booklet Issued: No Restrictions Weight Bearing Restrictions: Yes RLE Weight Bearing: Weight bearing as tolerated      Mobility  Bed Mobility Overal bed mobility: Needs Assistance Bed Mobility: Supine to Sit     Supine to sit: Min assist     General bed mobility comments: cues for sequencing. Very slow for transfer. Once seated at EOB, upright posture noted  Transfers Overall transfer level: Needs assistance Equipment used: Rolling walker (2 wheeled) Transfers: Sit to/from Stand Sit to Stand: Min assist         General transfer comment: needs cues for sequencing and hand placement. Also needs assist for hip precautions  Ambulation/Gait Ambulation/Gait assistance: Min assist Gait  Distance (Feet): 5 Feet Assistive device: Rolling walker (2 wheeled) Gait Pattern/deviations: Step-to pattern     General Gait Details: ambulated from bed->chair with multiple cues for sequencing and maintaining hip precautions. Needs assist for hand placement and tends to demonstrate trunk flexion. Pt very fearful of mobility  Stairs            Wheelchair Mobility    Modified Rankin (Stroke Patients Only)       Balance Overall balance assessment: Needs assistance Sitting-balance support: No upper extremity supported;Feet supported Sitting balance-Leahy Scale: Fair     Standing balance support: Bilateral upper extremity supported Standing balance-Leahy Scale: Fair Standing balance comment: B UE support on RW                             Pertinent Vitals/Pain Pain Assessment: 0-10 Pain Score: 5  Pain Location: R hip Pain Descriptors / Indicators: Operative site guarding Pain Intervention(s): Limited activity within patient's tolerance;Repositioned;Ice applied;RN gave pain meds during session    [place] expects to be discharged to:: Private residence Living Arrangements: Spouse/significant other Available Help at Discharge: Family;Available 24 hours/day Type of Home: House Home Access: Stairs to enter Entrance Stairs-Rails: Can reach both Entrance Stairs-Number of Steps: 3 Home Layout: One level Home Equipment: Walker - 2 wheels;Cane - quad;Bedside commode      Prior Function Level of Independence: Independent with assistive device(s)         Comments: was primarily using QC for mobility. Reports she is a household ambulator and only leaves house for MD appointments. Appears to be receiving HHPT prior to admission  Hand Dominance        Extremity/Trunk Assessment   Upper Extremity Assessment Upper Extremity Assessment: Overall WFL for tasks assessed    Lower Extremity Assessment Lower Extremity Assessment:  Generalized weakness (R LE grossly 3/5; L LE grossly 4/5)       Communication   Communication: No difficulties  Cognition Arousal/Alertness: Awake/alert Behavior During Therapy: WFL for tasks assessed/performed Overall Cognitive Status: Within Functional Limits for tasks assessed                                        General Comments      Exercises Other Exercises Other Exercises: supine ther-ex performed on R LE including AP, quad sets, SAQ, and hip abd/add. All therex performed x 10 reps with min assist and is limited by pain. Keeps R knee in guarded flexed position. Other Exercises: During ambulation, pt has small BM in floor. May need diaper for further ambulation   Assessment/Plan    PT Assessment Patient needs continued PT services  PT Problem List Decreased strength;Decreased activity tolerance;Decreased balance;Decreased mobility;Decreased knowledge of use of DME;Pain       PT Treatment Interventions DME instruction;Gait training;Stair training;Therapeutic exercise;Balance training    PT Goals (Current goals can be found in the Care Plan section)  Acute Rehab PT Goals Patient Stated Goal: to go home PT Goal Formulation: With patient Time For Goal Achievement: 10/17/19 Potential to Achieve Goals: Good    Frequency BID   Barriers to discharge        Co-evaluation               AM-PAC PT "6 Clicks" Mobility  Outcome Measure Help needed turning from your back to your side while in a flat bed without using bedrails?: A Little Help needed moving from lying on your back to sitting on the side of a flat bed without using bedrails?: A Little Help needed moving to and from a bed to a chair (including a wheelchair)?: A Little Help needed standing up from a chair using your arms (e.g., wheelchair or bedside chair)?: A Little Help needed to walk in hospital room?: A Little Help needed climbing 3-5 steps with a railing? : A Lot 6 Click Score:  17    End of Session Equipment Utilized During Treatment: Gait belt Activity Tolerance: Patient limited by pain Patient left: in chair;with chair alarm set;with family/visitor present;with SCD's reapplied Nurse Communication: Mobility status PT Visit Diagnosis: Muscle weakness (generalized) (M62.81);Difficulty in walking, not elsewhere classified (R26.2);Pain Pain - Right/Left: Right Pain - part of body: Hip    Time: 1530-1609 PT Time Calculation (min) (ACUTE ONLY): 39 min   Charges:   PT Evaluation $PT Eval Moderate Complexity: 1 Mod PT Treatments $Gait Training: 8-22 mins $Therapeutic Exercise: 8-22 mins        Elizabeth Palau, PT, DPT [phone]   Carmon Sahli 10/03/2019, 4:35 PM

## 2019-10-03 NOTE — H&P (Signed)
The patient has been re-examined, and the chart reviewed, and there have been no interval changes to the documented history and physical.    The risks, benefits, and alternatives have been discussed at length. The patient expressed understanding of the risks benefits and agreed with plans for surgical intervention.  Normand Damron P. Idelia Caudell, Jr. M.D.    

## 2019-10-03 NOTE — Transfer of Care (Signed)
Immediate Anesthesia Transfer of Care Note  Patient: Sarah Shields  Procedure(s) Performed: TOTAL HIP ARTHROPLASTY (Right Hip)  Patient Location: PACU  Anesthesia Type:Spinal  Level of Consciousness: awake, alert  and oriented  Airway & Oxygen Therapy: Patient Spontanous Breathing and Patient connected to face mask oxygen  Post-op Assessment: Report given to RN and Post -op Vital signs reviewed and stable  Post vital signs: Reviewed and stable  Last Vitals:  Vitals Value Taken Time  BP    Temp    Pulse 66 10/03/19 1223  Resp 11 10/03/19 1223  SpO2 100 % 10/03/19 1223  Vitals shown include unvalidated device data.  Last Pain:  Vitals:   10/03/19 0755  TempSrc: Temporal  PainSc: 5          Complications: No complications documented.

## 2019-10-03 NOTE — Op Note (Signed)
OPERATIVE NOTE  DATE OF SURGERY:  10/03/2019  PATIENT NAME:  Sarah Shields   DOB: [birth year]  MRN: [id number]  PRE-OPERATIVE DIAGNOSIS: Degenerative arthrosis of the right hip, primary  POST-OPERATIVE DIAGNOSIS:  Same  PROCEDURE:  Right total hip arthroplasty  SURGEON:  Jena Gauss. M.D.  ASSISTANT: Baldwin Jamaica, PA-C (present and scrubbed throughout the case, critical for assistance with exposure, retraction, instrumentation, and closure)  ANESTHESIA: spinal  ESTIMATED BLOOD LOSS: 100 mL  FLUIDS REPLACED: 1600 mL of crystalloid  DRAINS: 2 medium drains to a Hemovac reservoir  IMPLANTS UTILIZED: DePuy 12 mm small stature AML femoral stem, 48 mm OD Pinnacle Gription Sector acetabular component, 6.5 x 25 mm cancellous screw, neutral Pinnacle Altrx polyethylene insert, and a 32 mm CoCr +1 mm hip ball  INDICATIONS FOR SURGERY: Sarah Shields is a 74 y.o. year old female with a long history of progressive hip and groin  pain. X-rays demonstrated severe degenerative changes. The patient had not seen any significant improvement despite conservative nonsurgical intervention. After discussion of the risks and benefits of surgical intervention, the patient expressed understanding of the risks benefits and agree with plans for total hip arthroplasty.   The risks, benefits, and alternatives were discussed at length including but not limited to the risks of infection, bleeding, nerve injury, stiffness, blood clots, the need for revision surgery, limb length inequality, dislocation, cardiopulmonary complications, among others, and they were willing to proceed.  PROCEDURE IN DETAIL: The patient was brought into the operating room and, after adequate spinal anesthesia was achieved, the patient was placed in a left lateral decubitus position. Axillary roll was placed and all bony prominences were well-padded. The patient's right hip was cleaned and prepped with alcohol and DuraPrep and draped in the usual  sterile fashion. A "timeout" was performed as per usual protocol. A lateral curvilinear incision was made gently curving towards the posterior superior iliac spine. The IT band was incised in line with the skin incision and the fibers of the gluteus maximus were split in line. The piriformis tendon was identified, skeletonized, and incised at its insertion to the proximal femur and reflected posteriorly. A T type posterior capsulotomy was performed. Prior to dislocation of the femoral head, a threaded Steinmann pin was inserted through a separate stab incision into the pelvis superior to the acetabulum and bent in the form of a stylus so as to assess limb length and hip offset throughout the procedure. The femoral head was then dislocated posteriorly. Inspection of the femoral head demonstrated severe degenerative changes with full-thickness loss of articular cartilage. The femoral neck cut was performed using an oscillating saw. The anterior capsule was elevated off of the femoral neck using a periosteal elevator. Attention was then directed to the acetabulum. The remnant of the labrum was excised using electrocautery. Inspection of the acetabulum also demonstrated significant degenerative changes. The acetabulum was reamed in sequential fashion up to a 47 mm diameter. Good punctate bleeding bone was encountered. A 48 mm Pinnacle Gription Sector acetabular component was positioned and impacted into place. Good scratch fit was appreciated.  A 6.5 x 25 mm cancellous screw was placed through one of the dome holes for additional fixation.  A neutral polyethylene trial was inserted.  Attention was then directed to the proximal femur. A hole for reaming of the proximal femoral canal was created using a high-speed burr. The femoral canal was reamed in sequential fashion up to a 11.5 mm diameter. This allowed for approximately 7 cm of  scratch fit.  It was thus elected to ream up to a 12 mm diameter to allow for a line to  line fit.  Serial broaches were inserted up to a 12 mm small stature femoral broach. Calcar region was planed and a trial reduction was performed using a 32 mm hip ball with a +1 mm neck length. Good equalization of limb lengths and hip offset was appreciated and excellent stability was noted both anteriorly and posteriorly. Trial components were removed. The acetabular shell was irrigated with copious amounts of normal saline with antibiotic solution and suctioned dry. A neutral Pinnacle Altrx polyethylene insert was positioned and impacted into place. Next, a 12 mm small stature AML femoral stem was positioned and impacted into place. Excellent scratch fit was appreciated. A trial reduction was again performed with a 32 mm hip ball with a +1 mm neck length. Again, good equalization of limb lengths was appreciated and excellent stability appreciated both anteriorly and posteriorly. The hip was then dislocated and the trial hip ball was removed. The Morse taper was cleaned and dried. A 32 mm cobalt chrome hip ball with a +1 mm neck length was placed on the trunnion and impacted into place. The hip was then reduced and placed through range of motion. Excellent stability was appreciated both anteriorly and posteriorly.  The wound was irrigated with copious amounts of normal saline with antibiotic solution and suctioned dry. Good hemostasis was appreciated. The posterior capsulotomy was repaired using #5 Ethibond. Piriformis tendon was reapproximated to the undersurface of the gluteus medius tendon using #5 Ethibond. Two medium drains were placed in the wound bed and brought out through separate stab incisions to be attached to a Hemovac reservoir. The IT band was reapproximated using interrupted sutures of #1 Vicryl. Subcutaneous tissue was approximated using first #0 Vicryl followed by #2-0 Vicryl. The skin was closed with skin staples.  The patient tolerated the procedure well and was transported to the recovery  room in stable condition.   Marciano Sequin., M.D.

## 2019-10-04 ENCOUNTER — Encounter: Payer: Self-pay | Admitting: Orthopedic Surgery

## 2019-10-04 NOTE — Progress Notes (Signed)
Physical Therapy Treatment Patient Details Name: Sarah Shields MRN: [id number] DOB: 08/05/1945 Today's Date: 10/04/2019    History of Present Illness Pt is POD 1 of R THR. History of arthritis.    PT Comments    Pt progressing well towards goals. Pt increased ambulation distance to around the nurses station, requiring CGA for safety and steadying. Utilized youth RW this afternoon's session with good success therefore recommend ordering for d/c. Pt continues to require increased verbal cues to remember and maintain hip precautions and for BUE/BLE placement during transfers and ambulation. Pt able to recall 2/3 hip precautions. Will continue with education of hip precautions, safe transfer technique, and exercises. Will progress pt as tolerated.    Follow Up Recommendations  Home health PT;Supervision/Assistance - 24 hour     Equipment Recommendations  Other (comment) (youth RW)    Recommendations for Other Services       Precautions / Restrictions Precautions Precautions: Posterior Hip;Fall Precaution Booklet Issued: Yes (comment) Precaution Comments: 2/3 precautions recalled; needs reminder for no hip flexion precaution Restrictions RLE Weight Bearing: Weight bearing as tolerated    Mobility  Bed Mobility               General bed mobility comments: Pt still in recliner chair so bed mobility not performed.  Transfers Overall transfer level: Needs assistance Equipment used: Rolling walker (2 wheeled) Transfers: Sit to/from Stand Sit to Stand: Min guard         General transfer comment: pt continues to require verbal cues for BUE and RLE positioning during transfers; CGA for balance when transitioning hands to/from RW  Ambulation/Gait Ambulation/Gait assistance: Min guard Gait Distance (Feet): 250 Feet Assistive device: Rolling walker (2 wheeled) Gait Pattern/deviations: Step-through pattern;Decreased step length - left Gait velocity: 10' in 18"   General Gait  Details: Pt initially ambulated with step to pattern however progressed to reciprocal pattern after ~100'. Pt with slightly decreased step length on LLE secondary to pain in R hip.    Stairs             Wheelchair Mobility    Modified Rankin (Stroke Patients Only)       Balance Overall balance assessment: Needs assistance Sitting-balance support: Feet supported;Feet unsupported;Bilateral upper extremity supported Sitting balance-Leahy Scale: Good Sitting balance - Comments: Required verbal cues to maintain hip precautions in sitting. When feet were unsupported, pt used BUE support.   Standing balance support: Bilateral upper extremity supported Standing balance-Leahy Scale: Fair                              Cognition Arousal/Alertness: Awake/alert Behavior During Therapy: WFL for tasks assessed/performed Overall Cognitive Status: Within Functional Limits for tasks assessed                                        Exercises Other Exercises Other Exercises: In sitting: pet performed heel slides, LAQ, and glute sets x 12 reps. Pt required min A for heel slides secondary to weakness and pain. Other Exercises: Pt ambulated to [facility] and able to transfer to/from [facility] with CGA for balance. Pt required assist for pericare and donning of brief.    General Comments        Pertinent Vitals/Pain Pain Assessment: 0-10 Pain Score: 5  Pain Location: R hip Pain Descriptors / Indicators: Aching;Discomfort Pain Intervention(s): Repositioned;Monitored  during session    Home Living                      Prior Function            PT Goals (current goals can now be found in the care plan section) Acute Rehab PT Goals Patient Stated Goal: to go home PT Goal Formulation: With patient Time For Goal Achievement: 10/17/19 Potential to Achieve Goals: Good Progress towards PT goals: Progressing toward goals    Frequency    BID      PT Plan  Current plan remains appropriate    Co-evaluation              AM-PAC PT "6 Clicks" Mobility   Outcome Measure  Help needed turning from your back to your side while in a flat bed without using bedrails?: A Little Help needed moving from lying on your back to sitting on the side of a flat bed without using bedrails?: A Little Help needed moving to and from a bed to a chair (including a wheelchair)?: A Little Help needed standing up from a chair using your arms (e.g., wheelchair or bedside chair)?: A Little Help needed to walk in hospital room?: A Little Help needed climbing 3-5 steps with a railing? : A Lot 6 Click Score: 17    End of Session Equipment Utilized During Treatment: Gait belt Activity Tolerance: Patient tolerated treatment well Patient left: in chair;with call bell/phone within reach;with chair alarm set;with family/visitor present;with SCD's reapplied Nurse Communication: Mobility status PT Visit Diagnosis: Muscle weakness (generalized) (M62.81);Difficulty in walking, not elsewhere classified (R26.2);Pain Pain - Right/Left: Right Pain - part of body: Hip     Time: 9604-5409 PT Time Calculation (min) (ACUTE ONLY): 47 min  Charges:  $Gait Training: 8-22 mins $Therapeutic Exercise: 8-22 mins $Therapeutic Activity: 8-22 mins                     Frederich Chick, SPT   Benny Henrie 10/04/2019, 4:39 PM

## 2019-10-04 NOTE — TOC Initial Note (Signed)
Transition of Care [facility]) - Initial/Assessment Note    Patient Details  Name: Sarah Shields MRN: [id number] Date of Birth: [birth year]  [facility] CM/SW Contact:    Su Hilt, RN Phone Number: 10/04/2019, 2:16 PM  Clinical Narrative:                 Met with the patient in the room, She lives at home with her husband He will be providing transportation She had a RW and a 3 n 1 at home and no other DME needed She is set up with [facility] Lovenox price was requested, Will notify the patient once obtained    Expected Discharge Plan: [place] Barriers to Discharge: Continued Medical Work up   Patient Goals and CMS Choice Patient states their goals for this hospitalization and ongoing recovery are:: go home      Expected Discharge Plan and Services Expected Discharge Plan: [place]   Discharge Planning Services: CM Consult   Living arrangements for the past 2 months: Single Family Home                 DME Arranged: N/A         HH Arranged: PT, OT [place] Agency: [facility] (formerly [employer]) Date [place]: 10/04/19 Time [place]: 1416 Representative spoke with at [place]: Helene Kelp  Prior Living Arrangements/Services Living arrangements for the past 2 months: [place] Lives with:: Spouse Patient language and need for interpreter reviewed:: Yes Do you feel safe going back to the place where you live?: Yes      Need for Family Participation in Patient Care: No (Comment) Care giver support system in place?: Yes (comment) Current home services: DME (RW and 3 in 1) Criminal Activity/Legal Involvement Pertinent to Current Situation/Hospitalization: No - Comment as needed  Activities of Daily Living Home Assistive Devices/Equipment: Cane (specify quad or straight) (quad) ADL Screening (condition at time of admission) Patient's cognitive ability adequate to safely complete  daily activities?: Yes Is the patient deaf or have difficulty hearing?: No Does the patient have difficulty seeing, even when wearing glasses/contacts?: No Does the patient have difficulty concentrating, remembering, or making decisions?: No Patient able to express need for assistance with ADLs?: Yes Does the patient have difficulty dressing or bathing?: No Independently performs ADLs?: Yes (appropriate for developmental age) Does the patient have difficulty walking or climbing stairs?: Yes Weakness of Legs: Right Weakness of Arms/Hands: None  Permission Sought/Granted   Permission granted to share information with : Yes, Verbal Permission Granted              Emotional Assessment Appearance:: Appears stated age Attitude/Demeanor/Rapport: Engaged Affect (typically observed): Appropriate Orientation: : Oriented to Situation, Oriented to  Time, Oriented to Place, Oriented to Self Alcohol / Substance Use: Not Applicable Psych Involvement: No (comment)  Admission diagnosis:  Hx of total hip arthroplasty, right [M35.597] Patient Active Problem List   Diagnosis Date Noted  . Hx of total hip arthroplasty, right 10/03/2019   PCP:  Alvera Singh, FNP Pharmacy:   [facility], [place], [address] [place] [address] Phone: [phone] Fax: [phone]     Social Determinants of Health (SDOH) Interventions    Readmission Risk Interventions No flowsheet data found.

## 2019-10-04 NOTE — Progress Notes (Signed)
Physical Therapy Treatment Patient Details Name: Sarah Shields MRN: [id number] DOB: 01-14-1946 Today's Date: 10/04/2019    History of Present Illness Pt is POD 1 of R THR. History of arthritis.    PT Comments    Pt received in recliner chair upon arrival to room but awake and alert and agreeable to PT session. Pt able to verbalize 2/3 posterior hip precautions however appears to have difficulty understanding what the precautions mean as she requires continual and maximal cues throughout session with transfers and ambulation. Pt performed therex x 12 reps seated in recliner chair requiring tactile cues for correct technique and min A for hip abduction secondary to weakness. Pt able to ambulate to the nurses station and back using RW with min A for steadying and safety. Chair follow provided however not utilized. After ambulation and return to sitting in recliner chair, pt with small emesis into trash can. Will continue to follow during this acute stay and will progress as pt tolerates.  Follow Up Recommendations  Home health PT;Supervision/Assistance - 24 hour     Equipment Recommendations       Recommendations for Other Services       Precautions / Restrictions Precautions Precautions: Posterior Hip;Fall Precaution Booklet Issued: Yes (comment) Precaution Comments: Pt able to recall 2/3 posterior hip precautions however did not quite understand meaning or context. Restrictions Weight Bearing Restrictions: Yes RLE Weight Bearing: Weight bearing as tolerated    Mobility  Bed Mobility Overal bed mobility: Needs Assistance Bed Mobility: Supine to Sit     Supine to sit: Min assist     General bed mobility comments: Bed mobility not assessed as pt sitting up in recliner chair on arrival.  Transfers Overall transfer level: Needs assistance Equipment used: Rolling walker (2 wheeled) Transfers: Sit to/from Stand Sit to Stand: Min assist         General transfer comment: PT  requires continued and increased verbal cues for RLE placement and bilat hand placement throughout activity.  Ambulation/Gait Ambulation/Gait assistance: Min assist Gait Distance (Feet): 100 Feet Assistive device: Rolling walker (2 wheeled) Gait Pattern/deviations: Step-to pattern Gait velocity: decreased   General Gait Details: Pt ambulated from room to nsg station and back. Required min A for steadying and max verbal cues for RLE management during turns and walker management.   Stairs             Wheelchair Mobility    Modified Rankin (Stroke Patients Only)       Balance Overall balance assessment: Needs assistance Sitting-balance support: Feet supported Sitting balance-Leahy Scale: Fair     Standing balance support: Bilateral upper extremity supported Standing balance-Leahy Scale: Fair Standing balance comment: B UE support on RW                            Cognition Arousal/Alertness: Awake/alert Behavior During Therapy: WFL for tasks assessed/performed Overall Cognitive Status: No family/caregiver present to determine baseline cognitive functioning                                 General Comments: Pt alert and oriented x4, very poor immediate and delayed recall of precautions and problem solving to maintain precautions during session.      Exercises Other Exercises Other Exercises: Therex performed in recliner chair: APs. quad sets, and hip abduction. Hip abduction initially required min A then pt able to progress to  supv. All exercises 12 reps. Other Exercises: Extensive instruction in posterior THPs and how to maintain during bed mobility, ADL, and ADL mobility. Pt able to verbalize understanding and return demo with cues.    General Comments        Pertinent Vitals/Pain Pain Assessment: 0-10 Pain Score: 5  Pain Location: R hip Pain Descriptors / Indicators: Aching;Discomfort Pain Intervention(s): Limited activity within  patient's tolerance;Monitored during session;Repositioned    Home Living Family/patient expects to be discharged to:: Private residence Living Arrangements: Spouse/significant other Available Help at Discharge: Family;Available 24 hours/day Type of Home: House Home Access: Stairs to enter Entrance Stairs-Rails: Can reach both Home Layout: One level Home Equipment: Walker - 2 wheels;Cane - quad;Bedside commode      Prior Function Level of Independence: Independent with assistive device(s)      Comments: was primarily using QC for mobility. Reports she is a household ambulator and only leaves house for MD appointments. Appears to be receiving HHPT prior to admission   PT Goals (current goals can now be found in the care plan section) Acute Rehab PT Goals Patient Stated Goal: to go home PT Goal Formulation: With patient Time For Goal Achievement: 10/17/19 Potential to Achieve Goals: Good Progress towards PT goals: Progressing toward goals    Frequency    BID      PT Plan Current plan remains appropriate    Co-evaluation              AM-PAC PT "6 Clicks" Mobility   Outcome Measure  Help needed turning from your back to your side while in a flat bed without using bedrails?: A Little Help needed moving from lying on your back to sitting on the side of a flat bed without using bedrails?: A Little Help needed moving to and from a bed to a chair (including a wheelchair)?: A Little Help needed standing up from a chair using your arms (e.g., wheelchair or bedside chair)?: A Little Help needed to walk in hospital room?: A Little Help needed climbing 3-5 steps with a railing? : A Lot 6 Click Score: 17    End of Session Equipment Utilized During Treatment: Gait belt Activity Tolerance: Patient limited by pain Patient left: in chair;with call bell/phone within reach;with chair alarm set;with SCD's reapplied Nurse Communication: Mobility status (also notified of emesis after  ambulation) PT Visit Diagnosis: Muscle weakness (generalized) (M62.81);Difficulty in walking, not elsewhere classified (R26.2);Pain Pain - Right/Left: Right Pain - part of body: Hip     Time: 9563-8756 PT Time Calculation (min) (ACUTE ONLY): 38 min  Charges:                        Frederich Chick, SPT   Frederich Chick 10/04/2019, 11:43 AM

## 2019-10-04 NOTE — Evaluation (Signed)
Occupational Therapy Evaluation Patient Details Name: Sarah Shields MRN: [id number] DOB: 14-Feb-1946 Today's Date: 10/04/2019    History of Present Illness Pt is POD 1 of R THR. History of arthritis.   Clinical Impression   Pt seen for OT evaluation this date, POD#1 from above surgery. Pt was independent in all ADL prior to surgery, and using a quad cane for majority of household ambulation. Pt reports having been unable to drive 2/2 R hip pain prior to surgery. Pt is eager to return to PLOF with less pain and improved safety and independence and get back to driving. No additional family to verify PLOF provided. Pt currently requires Mod-Max A for LB ADL including compression stocking mgt due to pain and limited AROM of R hip with poor recall and carryover of posterior total hip precautions. Pt unable to recall any posterior total hip precautions at start of session and unable to verbalize how to implement during ADL and mobility. Pt extensively instructed in posterior total hip precautions and how to implement, self care skills, falls prevention strategies, home/routines modifications, DME/AE for LB bathing and dressing tasks, and compression stocking mgt strategies. At end of session, pt able to recall 1/3 posterior total hip precautions. Pt would benefit from additional instruction in self care skills and techniques to help maintain precautions with or without assistive devices to support recall and carryover prior to discharge. Recommend HHOT and 24/7 sup/assist upon discharge. Pt currently unsafe to return home without 24/7 sup/assist 2/2 difficulty recalling and maintaining hip precautions on her own.     Follow Up Recommendations  Home health OT;Supervision/Assistance - 24 hour    Equipment Recommendations  None recommended by OT    Recommendations for Other Services       Precautions / Restrictions Precautions Precautions: Posterior Hip;Fall Precaution Booklet Issued: Yes  (comment) Precaution Comments: Unable to recall from previous date, recalls 1/3 with immediate recall following extensive education/training. 0/3 delayed recall. Restrictions Weight Bearing Restrictions: Yes RLE Weight Bearing: Weight bearing as tolerated      Mobility Bed Mobility Overal bed mobility: Needs Assistance Bed Mobility: Supine to Sit     Supine to sit: Min assist     General bed mobility comments: Min A for RLE and Max VC for sequencing to maintain hip precautions  Transfers Overall transfer level: Needs assistance Equipment used: Rolling walker (2 wheeled) Transfers: Sit to/from Stand Sit to Stand: Min assist         General transfer comment: Max VC for hand/foot placement, RW mgt, and maintaining hip precautions    Balance Overall balance assessment: Needs assistance Sitting-balance support: No upper extremity supported;Feet supported Sitting balance-Leahy Scale: Fair     Standing balance support: Bilateral upper extremity supported Standing balance-Leahy Scale: Fair Standing balance comment: B UE support on RW                           ADL either performed or assessed with clinical judgement   ADL Overall ADL's : Needs assistance/impaired                                       General ADL Comments: Pt unable to recall and maintain posterior THPs without moderate-max cueing. Requires Max A for compression stocking mgt, Mod A for LB dressing. Set up and supervision for seated UB ADL, and CGA for standing at sink  for grooming tasks.     Vision Baseline Vision/History: Wears glasses Wears Glasses: At all times Patient Visual Report: No change from baseline Vision Assessment?: No apparent visual deficits     Perception     Praxis      Pertinent Vitals/Pain Pain Assessment: 0-10 Pain Score: 4  Pain Location: R hip after ADL transfers and amb to recliner Pain Descriptors / Indicators: Aching Pain Intervention(s):  Limited activity within patient's tolerance;Monitored during session;Repositioned;Premedicated before session     Hand Dominance     Extremity/Trunk Assessment Upper Extremity Assessment Upper Extremity Assessment: Overall WFL for tasks assessed   Lower Extremity Assessment Lower Extremity Assessment: Generalized weakness;RLE deficits/detail RLE Deficits / Details: expected post-op strength/ROM deficits RLE: Unable to fully assess due to pain       Communication Communication Communication: No difficulties   Cognition Arousal/Alertness: Awake/alert Behavior During Therapy: WFL for tasks assessed/performed Overall Cognitive Status: No family/caregiver present to determine baseline cognitive functioning                                 General Comments: Pt alert and oriented x4, very poor immediate and delayed recall of precautions and problem solving to maintain precautions during session.   General Comments       Exercises Other Exercises Other Exercises: Extensive instruction in posterior THPs and how to maintain during bed mobility, ADL, and ADL mobility. Pt able to verbalize understanding and return demo with cues.   Shoulder Instructions      Home Living Family/patient expects to be discharged to:: Private residence Living Arrangements: Spouse/significant other Available Help at Discharge: Family;Available 24 hours/day Type of Home: House Home Access: Stairs to enter [employer] of Steps: 3 Entrance Stairs-Rails: Can reach both Home Layout: One level     Bathroom Shower/Tub: Chief Strategy Officer: Standard     Home Equipment: Environmental consultant - 2 wheels;Cane - quad;Bedside commode          Prior Functioning/Environment Level of Independence: Independent with assistive device(s)        Comments: was primarily using QC for mobility. Reports she is a household ambulator and only leaves house for MD appointments. Appears to be  receiving HHPT prior to admission        OT Problem List: Decreased strength;Pain;Decreased range of motion;Decreased cognition;Decreased safety awareness;Impaired balance (sitting and/or standing);Decreased knowledge of use of DME or AE;Decreased knowledge of precautions      OT Treatment/Interventions: Self-care/ADL training;Therapeutic exercise;Therapeutic activities;Cognitive remediation/compensation;DME and/or AE instruction;Patient/family education;Balance training    OT Goals(Current goals can be found in the care plan section) Acute Rehab OT Goals Patient Stated Goal: to go home OT Goal Formulation: With patient Time For Goal Achievement: 10/18/19 Potential to Achieve Goals: Good ADL Goals Pt Will Perform Lower Body Dressing: sit to/from stand;with caregiver independent in assisting (maintaining precautions w/ VC PRN) Pt Will Transfer to Toilet: ambulating;with supervision;bedside commode (LRAD for amb, VC for hip precautions) Additional ADL Goal #1: Pt/caregiver will perform compression stocking mgt with modified independence while pt maintains hip precautions with PRN VCs. Additional ADL Goal #2: Pt/caregiver will recall 3/3 posterior hip precautions and how to maintain during ADL/mobility to maximize safety/independent.  OT Frequency: Min 2X/week   Barriers to D/C:            Co-evaluation              AM-PAC OT "6 Clicks" Daily Activity  Outcome Measure Help from another person eating meals?: None Help from another person taking care of personal grooming?: A Little Help from another person toileting, which includes using toliet, bedpan, or urinal?: A Little Help from another person bathing (including washing, rinsing, drying)?: A Lot Help from another person to put on and taking off regular upper body clothing?: None Help from another person to put on and taking off regular lower body clothing?: A Lot 6 Click Score: 18   End of Session Equipment Utilized  During Treatment: Gait belt;Rolling walker  Activity Tolerance: Patient tolerated treatment well Patient left: in chair;with call bell/phone within reach;with chair alarm set;Other (comment) (pillows placed between BLE)  OT Visit Diagnosis: Other abnormalities of gait and mobility (R26.89);Muscle weakness (generalized) (M62.81);Pain Pain - Right/Left: Right Pain - part of body: Hip                Time: 8527-7824 OT Time Calculation (min): 38 min Charges:  OT General Charges $OT Visit: 1 Visit OT Evaluation $OT Eval Moderate Complexity: 1 Mod OT Treatments $Self Care/Home Management : 23-37 mins  Richrd Prime, MPH, MS, OTR/L ascom [phone] 10/04/19, 10:58 AM

## 2019-10-04 NOTE — Progress Notes (Signed)
  Subjective: 1 Day Post-Op Procedure(s) (LRB): TOTAL HIP ARTHROPLASTY (Right) Patient reports pain as well-controlled.   Patient is well, and has had no acute complaints or problems Plan is to go Home after hospital stay. Negative for chest pain and shortness of breath Fever: no Gastrointestinal: negative for nausea and vomiting.   Patient has not had a bowel movement.  Objective: Vital signs in last 24 hours: Temp:  [97.4 F (36.3 C)-98.1 F (36.7 C)] 97.5 F (36.4 C) (06/17 0734) Pulse Rate:  [63-82] 80 (06/17 0734) Resp:  [11-20] 16 (06/17 0734) BP: (112-143)/(60-91) 128/71 (06/17 0734) SpO2:  [94 %-100 %] 100 % (06/17 0734)  Intake/Output from previous day:  Intake/Output Summary (Last 24 hours) at 10/04/2019 0909 Last data filed at 10/04/2019 0300 Gross per 24 hour  Intake 3329.28 ml  Output 1030 ml  Net 2299.28 ml    Intake/Output this shift: No intake/output data recorded.  Labs: No results for input(s): HGB in the last 72 hours. No results for input(s): WBC, RBC, HCT, PLT in the last 72 hours. No results for input(s): NA, K, CL, CO2, BUN, CREATININE, GLUCOSE, CALCIUM in the last 72 hours. No results for input(s): LABPT, INR in the last 72 hours.   EXAM General - Patient is Alert, Appropriate and Oriented Extremity - Neurovascular intact Dorsiflexion/Plantar flexion intact Compartment soft Dressing/Incision -clean, dry, Hemovac in place.  Motor Function - intact, moving foot and toes well on exam.  Cardiovascular- Regular rate and rhythm, no murmurs/rubs/gallops Respiratory- Lungs clear to auscultation bilaterally Gastrointestinal- soft, nontender and active bowel sounds   Assessment/Plan: 1 Day Post-Op Procedure(s) (LRB): TOTAL HIP ARTHROPLASTY (Right) Active Problems:   Hx of total hip arthroplasty, right  Estimated body mass index is 20.27 kg/m as calculated from the following:   Height as of this encounter: 4\' 10"  (1.473 m).   Weight as of this  encounter: 44 kg. Advance diet Up with therapy Plan for discharge tomorrow    DVT Prophylaxis - Lovenox, Ted hose and foot pumps Weight-Bearing as tolerated to right leg  , PA-C [facility] 10/04/2019, 9:09 AM

## 2019-10-05 LAB — SURGICAL PATHOLOGY

## 2019-10-05 MED ORDER — OXYCODONE HCL 5 MG PO TABS: 5.0000 mg | ORAL_TABLET | ORAL | 0 refills | Status: AC | PRN

## 2019-10-05 MED ORDER — CELECOXIB 200 MG PO CAPS: 200.0000 mg | ORAL_CAPSULE | Freq: Two times a day (BID) | ORAL | 0 refills | Status: AC

## 2019-10-05 MED ORDER — ENOXAPARIN SODIUM 40 MG/0.4ML ~~LOC~~ SOLN
40.0000 mg | SUBCUTANEOUS | 0 refills | Status: AC
Start: 2019-10-05 — End: 2019-10-19

## 2019-10-05 MED ORDER — TRAMADOL HCL 50 MG PO TABS: 50.0000 mg | ORAL_TABLET | ORAL | 0 refills | Status: AC | PRN

## 2019-10-05 NOTE — Progress Notes (Signed)
Pt was up amb with p.t. in hall. tol well. Sat on bsc  After amb. And had medium size  Bm.   Husband jerry called to transport pt  Home.

## 2019-10-05 NOTE — TOC Progression Note (Signed)
Transition of Care [facility]) - Progression Note    Patient Details  Name: Sarah Shields MRN: [id number] Date of Birth: 1945-07-10  [facility] CM/SW Contact  Barrie Dunker, RN Phone Number: 10/05/2019, 12:23 PM  Clinical Narrative:    The patient is to DC today with [facility] for [facility] services, she needs a Youth size rolling walker, The order is on, I called Zack with [employer] to arrange and it will be brought into the room   Expected Discharge Plan: Home w Home Health Services Barriers to Discharge: Continued Medical Work up  Expected Discharge Plan and Services Expected Discharge Plan: Home w Home Health Services   Discharge Planning Services: CM Consult   Living arrangements for the past 2 months: Single Family Home                 DME Arranged: Walker rolling         HH Arranged: PT, OT HH Agency: [facility] Date HH Agency Contacted: 10/04/19 Time HH Agency Contacted: [phone] Representative spoke with at [facility] Agency: Rosey Bath   Social Determinants of Health (SDOH) Interventions    Readmission Risk Interventions No flowsheet data found.

## 2019-10-05 NOTE — Discharge Summary (Signed)
Physician Discharge Summary  Patient ID: Sarah Shields MRN: [id number] DOB/AGE: [birth year] 74 y.o.  Admit date: 10/03/2019 Discharge date: 10/05/2019  Admission Diagnoses:  Hx of total hip arthroplasty, right [Z96.641]  Surgeries:Procedure(s):   Right total hip arthroplasty  SURGEON:  Jena Gauss. M.D.  ASSISTANT: Baldwin Jamaica, PA-C (present and scrubbed throughout the case, critical for assistance with exposure, retraction, instrumentation, and closure)  ANESTHESIA: spinal  ESTIMATED BLOOD LOSS: 100 mL  FLUIDS REPLACED: 1600 mL of crystalloid  DRAINS: 2 medium drains to a Hemovac reservoir  IMPLANTS UTILIZED: DePuy 12 mm small stature AML femoral stem, 48 mm OD [employer] G[employer] acetabular component, 6.5 x 25 mm cancellous screw, neutral Pinnacle Altrx polyethylene insert, and a 32 mm CoCr +1 mm hip ball  Discharge Diagnoses: Patient Active Problem List   Diagnosis Date Noted  . Hx of total hip arthroplasty, right 10/03/2019    Past Medical History:  Diagnosis Date  . Arthritis   . Hypothyroidism      Transfusion:    Consultants (if any):   Discharged Condition: Improved  Hospital Course: Sarah Shields is an 74 y.o. female who was admitted 10/03/2019 with a diagnosis of right hip osteoarthritis and went to the operating room on 10/03/2019 and underwent right total hip arthroplasty. The patient received perioperative antibiotics for prophylaxis (see below). The patient tolerated the procedure well and was transported to PACU in stable condition. After meeting PACU criteria, the patient was subsequently transferred to the Orthopaedics/Rehabilitation unit.   The patient received DVT prophylaxis in the form of early mobilization, Lovenox, Foot Pumps and TED hose. A sacral pad had been placed and heels were elevated off of the bed with rolled towels in order to protect skin integrity. Foley catheter was discontinued on postoperative day #0. Wound drains were  discontinued on postoperative day #2. The surgical incision was healing well without signs of infection.  Physical therapy was initiated postoperatively for transfers, gait training, and strengthening. Occupational therapy was initiated for activities of daily living and evaluation for assisted devices. Rehabilitation goals were reviewed in detail with the patient. The patient made steady progress with physical therapy and physical therapy recommended discharge to Home.   The patient achieved the preliminary goals of this hospitalization and was felt to be medically and orthopaedically appropriate for discharge.  She was given perioperative antibiotics:  Anti-infectives (From admission, onward)   Start     Dose/Rate Route Frequency Ordered Stop   10/03/19 1500  ceFAZolin (ANCEF) IVPB 2g/100 mL premix        2 g 200 mL/hr over 30 Minutes Intravenous Every 6 hours 10/03/19 1436 10/04/19 0850   10/03/19 0600  ceFAZolin (ANCEF) IVPB 2g/100 mL premix        2 g 200 mL/hr over 30 Minutes Intravenous On call to O.R. 10/03/19 0154 10/03/19 0900   10/03/19 0200  vancomycin (VANCOCIN) IVPB 1000 mg/200 mL premix  Status:  Discontinued        1,000 mg 200 mL/hr over 60 Minutes Intravenous  Once 10/03/19 0154 10/03/19 1421    .  Recent vital signs:  Vitals:   10/05/19 0045 10/05/19 0752  BP: 130/61 (!) 151/71  Pulse: 98 100  Resp: 14 17  Temp: 99 F (37.2 C) 98.5 F (36.9 C)  SpO2: 94% 96%    Recent laboratory studies:  No results for input(s): WBC, HGB, HCT, PLT, K, CL, CO2, BUN, CREATININE, GLUCOSE, CALCIUM, LABPT, INR in the last 72 hours.  Diagnostic Studies:  DG Hip Port Unilat With Pelvis 1V Right  Result Date: 10/03/2019 CLINICAL DATA:  Status post right hip replacement EXAM: DG HIP (WITH OR WITHOUT PELVIS) 1V PORT RIGHT COMPARISON:  None. FINDINGS: Right hip prosthesis is noted in place. Surgical drains are seen. No acute bony or soft tissue abnormality is noted. Pessary is seen in  place. IMPRESSION: Status post right hip replacement Electronically Signed   By: Inez Catalina M.D.   On: 10/03/2019 13:54    Discharge Medications:   Allergies as of 10/05/2019      Reactions   Cephalexin Diarrhea   Cefdinir Itching, Rash   Sulfa Antibiotics Rash      Medication List    STOP taking these medications   aspirin 81 MG EC tablet   meloxicam 7.5 MG tablet Commonly known as: MOBIC     TAKE these medications   alendronate 70 MG tablet Commonly known as: FOSAMAX Take 70 mg by mouth once a week.   calcium carbonate 1500 (600 Ca) MG Tabs tablet Commonly known as: OSCAL Take 600 mg by mouth at bedtime.   celecoxib 200 MG capsule Commonly known as: CELEBREX Take 1 capsule (200 mg total) by mouth 2 (two) times daily.   Cholecalciferol 25 MCG (1000 UT) tablet Take 1,000 Units by mouth at bedtime.   enoxaparin 40 MG/0.4ML injection Commonly known as: LOVENOX Inject 0.4 mLs (40 mg total) into the skin daily for 14 days.   latanoprost 0.005 % ophthalmic solution Commonly known as: XALATAN Place 1 drop into both eyes at bedtime.   levothyroxine 50 MCG tablet Commonly known as: SYNTHROID Take 50 mcg by mouth daily before breakfast.   oxyCODONE 5 MG immediate release tablet Commonly known as: Oxy IR/ROXICODONE Take 1 tablet (5 mg total) by mouth every 4 (four) hours as needed for moderate pain (pain score 4-6).   sodium chloride 1 g tablet Take 1 g by mouth 3 (three) times daily with meals.   traMADol 50 MG tablet Commonly known as: ULTRAM Take 1 tablet (50 mg total) by mouth every 4 (four) hours as needed for moderate pain.            Durable Medical Equipment  (From admission, onward)         Start     Ordered   10/05/19 1225  DME Walker rolling  Once       Comments: Youth size needed , patient is 4 ft 10 inches tall  Question:  Patient needs a walker to treat with the following condition  Answer:  S/P total hip arthroplasty   10/05/19 1224    10/03/19 1437  DME Bedside commode  Once       Question:  Patient needs a bedside commode to treat with the following condition  Answer:  S/P total hip arthroplasty   10/03/19 1436          Disposition: home with home health PT      Follow-up Information    Dereck Leep, MD On 11/15/2019.   Specialty: Orthopedic Surgery Why: at 1:30pm Contact information: [address] [place] [id number] [place], PA-C 10/05/2019, 1:13 PM

## 2019-10-05 NOTE — Progress Notes (Signed)
  Subjective: 2 Days Post-Op Procedure(s) (LRB): TOTAL HIP ARTHROPLASTY (Right) Patient reports pain as significant. Patient is well, and has had no acute complaints or problems Plan is to go Home after hospital stay. Negative for chest pain and shortness of breath Fever: no Gastrointestinal: negative for nausea and vomiting.   Patient reports having "a small bowel movement."  Objective: Vital signs in last 24 hours: Temp:  [97.9 F (36.6 C)-99 F (37.2 C)] 98.5 F (36.9 C) (06/18 0752) Pulse Rate:  [82-100] 100 (06/18 0752) Resp:  [14-17] 17 (06/18 0752) BP: (102-151)/(47-71) 151/71 (06/18 0752) SpO2:  [94 %-99 %] 96 % (06/18 0752)  Intake/Output from previous day:  Intake/Output Summary (Last 24 hours) at 10/05/2019 0935 Last data filed at 10/05/2019 0045 Gross per 24 hour  Intake 240 ml  Output 40 ml  Net 200 ml    Intake/Output this shift: No intake/output data recorded.  Labs: No results for input(s): HGB in the last 72 hours. No results for input(s): WBC, RBC, HCT, PLT in the last 72 hours. No results for input(s): NA, K, CL, CO2, BUN, CREATININE, GLUCOSE, CALCIUM in the last 72 hours. No results for input(s): LABPT, INR in the last 72 hours.   EXAM General - Patient is Alert, Appropriate and Oriented Extremity - Neurovascular intact Dorsiflexion/Plantar flexion intact Compartment soft Dressing/Incision -clean, dry, no drainage, Hemovac in place.  Motor Function - intact, moving foot and toes well on exam.  Cardiovascular- Slightly elevated rate, normal rhythm, no m/r/g Respiratory- Lungs clear to auscultation bilaterally Gastrointestinal- soft, active bowel sounds, tender to palpation along the lower abdomen  Assessment/Plan: 2 Days Post-Op Procedure(s) (LRB): TOTAL HIP ARTHROPLASTY (Right) Active Problems:   Hx of total hip arthroplasty, right  Estimated body mass index is 20.27 kg/m as calculated from the following:   Height as of this encounter: 4'  10" (1.473 m).   Weight as of this encounter: 44 kg. Advance diet Up with therapy Discharge home with home health pending completion of therapy goals.  Hemovac removed. Fresh honeycomb dressing placed.   DVT Prophylaxis - Lovenox, Ted hose and foot pumps Weight-Bearing as tolerated to right leg  Baldwin Jamaica, PA-C [facility] Orthopaedic Surgery 10/05/2019, 9:35 AM

## 2019-10-05 NOTE — Progress Notes (Addendum)
Pt discharged home at this time.  A/o. Husband  Here to pick pt up. Discharge instructions discussed with pt and spouse jerry.  Sl d/cd.   meds diet activity and f/u discussed and  Kc discharge instructions discussed.  Verbalizes understanding.  Out via w/c. 3 honeycomb dressings provided for pt to take home and instructed in use.husband instructed  In giving pt  lovenox injections x 14 days.   He have given shots before.

## 2019-10-05 NOTE — Progress Notes (Signed)
Physical Therapy Treatment Patient Details Name: Sarah Shields MRN: [id number] DOB: 1945/06/05 Today's Date: 10/05/2019    History of Present Illness Pt is POD 2 of R THR. History of arthritis.    PT Comments    Pt was seated in recliner upon arriving. She request to ambulate 2/2 to trying to have BM so she can DC to home. She demonstrated safe ability to STS and ambulate 120 ft with RW without LOB. Requested to use BSC for BM. At conclusion of session, pt was seated on [facility] with call bell in reach and RN tech aware. Pt was able to recall 2/3 hip precautions. She is cleared from PT standpoint for safe DC home once cleared from medical standpoint.  Pt will benefit form skilled HHPT at DC to address deficits and assist pt to PLOF.   Follow Up Recommendations  Home health PT;Supervision/Assistance - 24 hour     Equipment Recommendations  Rolling walker with 5" wheels    Recommendations for Other Services       Precautions / Restrictions Precautions Precautions: Posterior Hip;Fall Precaution Booklet Issued: Yes (comment) Precaution Comments: able to recall 2/3 hip precautions; requires education for 3/3 precautions Restrictions Weight Bearing Restrictions: Yes RLE Weight Bearing: Weight bearing as tolerated    Mobility  Bed Mobility Overal bed mobility: Needs Assistance             General bed mobility comments: pt was in recliner pre session and on BSC afterwards  Transfers Overall transfer level: Needs assistance Equipment used: Rolling walker (2 wheeled) Transfers: Sit to/from Stand Sit to Stand: Supervision         General transfer comment: SBA + youth RW   Ambulation/Gait Ambulation/Gait assistance: Supervision Gait Distance (Feet): 120 Feet Assistive device: Rolling walker (2 wheeled) Gait Pattern/deviations: Step-to pattern;Antalgic     General Gait Details: pt was able to ambulate 120 ft with RW without LOB or unsteadiness. distances limited by pt  requesting to use BR for BM.   Stairs             Wheelchair Mobility    Modified Rankin (Stroke Patients Only)       Balance Overall balance assessment: Needs assistance Sitting-balance support: Feet supported;No upper extremity supported Sitting balance-Leahy Scale: Good Sitting balance - Comments: Pt verbalizes need to move foot forward to prevent breaking post hip pcns    Standing balance support: Bilateral upper extremity supported Standing balance-Leahy Scale: Fair Standing balance comment: BUE support on RW                            Cognition Arousal/Alertness: Awake/alert Behavior During Therapy: WFL for tasks assessed/performed Overall Cognitive Status: Within Functional Limits for tasks assessed                                        Exercises Other Exercises Other Exercises: Pt educated re: functional application of posterior hip pcns, falls prevention, energy conservation, DME recs Other Exercises: LBD, sit<>stand, sitting/standing balance/tolerance, in room ~30 ft mobility Other Exercises: x    General Comments        Pertinent Vitals/Pain Pain Assessment: No/denies pain Pain Score: 3  Faces Pain Scale: Hurts a little bit Pain Location: R hip Pain Descriptors / Indicators: Discomfort;Operative site guarding;Sore Pain Intervention(s): Limited activity within patient's tolerance;Monitored during session;Premedicated before session  Home Living                      Prior Function            PT Goals (current goals can now be found in the care plan section) Acute Rehab PT Goals Patient Stated Goal: to go home Progress towards PT goals: Progressing toward goals    Frequency    BID      PT Plan Current plan remains appropriate    Co-evaluation              AM-PAC PT "6 Clicks" Mobility   Outcome Measure  Help needed turning from your back to your side while in a flat bed without using  bedrails?: A Little Help needed moving from lying on your back to sitting on the side of a flat bed without using bedrails?: A Little Help needed moving to and from a bed to a chair (including a wheelchair)?: A Little Help needed standing up from a chair using your arms (e.g., wheelchair or bedside chair)?: A Little Help needed to walk in hospital room?: A Little Help needed climbing 3-5 steps with a railing? : A Little 6 Click Score: 18    End of Session Equipment Utilized During Treatment: Gait belt Activity Tolerance: Patient tolerated treatment well Patient left: Other (comment) (on [facility] with call bell in hand and Rn tech aware) Nurse Communication: Mobility status PT Visit Diagnosis: Muscle weakness (generalized) (M62.81);Difficulty in walking, not elsewhere classified (R26.2);Pain Pain - Right/Left: Right Pain - part of body: Hip     Time: 2774-1287 PT Time Calculation (min) (ACUTE ONLY): 13 min  Charges:  $Gait Training: 8-22 mins                     Jetta Lout PTA 10/05/19, 4:20 PM

## 2019-10-05 NOTE — Plan of Care (Signed)
  Problem: Education: Goal: Knowledge of General Education information will improve Description: Including pain rating scale, medication(s)/side effects and non-pharmacologic comfort measures Outcome: Progressing   Problem: Education: Goal: Knowledge of General Education information will improve Description: Including pain rating scale, medication(s)/side effects and non-pharmacologic comfort measures Outcome: Progressing   Problem: Health Behavior/Discharge Planning: Goal: Ability to manage health-related needs will improve Outcome: Progressing   Problem: Clinical Measurements: Goal: Ability to maintain clinical measurements within normal limits will improve Outcome: Progressing Goal: Will remain free from infection Outcome: Progressing Goal: Diagnostic test results will improve Outcome: Progressing Goal: Respiratory complications will improve Outcome: Progressing Goal: Cardiovascular complication will be avoided Outcome: Progressing   Problem: Clinical Measurements: Goal: Will remain free from infection Outcome: Progressing   Problem: Clinical Measurements: Goal: Ability to maintain clinical measurements within normal limits will improve Outcome: Progressing   Problem: Clinical Measurements: Goal: Diagnostic test results will improve Outcome: Progressing   Problem: Clinical Measurements: Goal: Respiratory complications will improve Outcome: Progressing   Problem: Clinical Measurements: Goal: Cardiovascular complication will be avoided Outcome: Progressing

## 2019-10-05 NOTE — Progress Notes (Signed)
Occupational Therapy Treatment Patient Details Name: Sarah Shields MRN: [id number] DOB: 1945/09/20 Today's Date: 10/05/2019    History of present illness Pt is POD 2 of R THR. History of arthritis.   OT comments  Sarah Shields was seen for OT treatment on this date. Upon arrival to room pt seated upright in chair, agreeable to tx. Pt instructed in functional application of posterior hip pcns, falls prevention, energy conservation, DME recs, and home/routines modifications. Requiring MAX A LBD seated EOB - pt verbalized plan for husband to assist c dressing seated in chair. SBA + RW adjust briefs in standing. CGA + youth RW for ~30 ft in room mobility. Pt able to recall 2/3 posterior hip precautions at start of session, recalls 3/3 c VCs. Pt verbalized understanding of instruction provided. Pt making good progress toward goals. Pt continues to benefit from skilled OT services to maximize return to PLOF and minimize risk of future falls, injury, caregiver burden, and readmission. Will continue to follow POC. Discharge recommendation remains appropriate.    Follow Up Recommendations  Home health OT;Supervision/Assistance - 24 hour    Equipment Recommendations  None recommended by OT    Recommendations for Other Services      Precautions / Restrictions Precautions Precautions: Posterior Hip;Fall Precaution Booklet Issued: Yes (comment) Precaution Comments: able to recall 2/3 hip precautions; requires education for 3/3 precautions Restrictions Weight Bearing Restrictions: Yes RLE Weight Bearing: Weight bearing as tolerated       Mobility Bed Mobility Overal bed mobility: Needs Assistance             General bed mobility comments: Pt received and left up in chair   Transfers Overall transfer level: Needs assistance Equipment used: Rolling walker (2 wheeled) Transfers: Sit to/from Stand Sit to Stand: Min guard         General transfer comment: SBA + youth RW     Balance Overall  balance assessment: Needs assistance Sitting-balance support: Feet supported;No upper extremity supported Sitting balance-Leahy Scale: Good Sitting balance - Comments: Pt verbalizes need to move foot forward to prevent breaking post hip pcns    Standing balance support: Bilateral upper extremity supported Standing balance-Leahy Scale: Fair Standing balance comment: BUE support on RW                           ADL either performed or assessed with clinical judgement   ADL Overall ADL's : Needs assistance/impaired                                       General ADL Comments: MAX A LBD seated EOB - pt verbalized plan for husband to assist c dressing seated in chair. SBA + RW adjust briefs in standing     Vision       Perception     Praxis      Cognition Arousal/Alertness: Awake/alert Behavior During Therapy: WFL for tasks assessed/performed Overall Cognitive Status: Within Functional Limits for tasks assessed                                          Exercises Other Exercises Other Exercises: Pt educated re: functional application of posterior hip pcns, falls prevention, energy conservation, DME recs Other Exercises: LBD, sit<>stand, sitting/standing balance/tolerance, in room ~30  ft mobility Other Exercises: x   Shoulder Instructions       General Comments      Pertinent Vitals/ Pain       Pain Assessment: Faces Faces Pain Scale: Hurts little more Pain Location: R hip Pain Descriptors / Indicators: Discomfort;Operative site guarding;Sore Pain Intervention(s): Limited activity within patient's tolerance;Repositioned  Home Living                                          Prior Functioning/Environment              Frequency  Min 2X/week        Progress Toward Goals  OT Goals(current goals can now be found in the care plan section)  Progress towards OT goals: Progressing toward goals  Acute  Rehab OT Goals Patient Stated Goal: to go home OT Goal Formulation: With patient Time For Goal Achievement: 10/18/19 Potential to Achieve Goals: Good ADL Goals Pt Will Perform Lower Body Dressing: sit to/from stand;with caregiver independent in assisting (maintaining precautions w/ VC PRN) Pt Will Transfer to Toilet: ambulating;with supervision;bedside commode (LRAD for amb, VC for hip precautions) Additional ADL Goal #1: Pt/caregiver will perform compression stocking mgt with modified independence while pt maintains hip precautions with PRN VCs. Additional ADL Goal #2: Pt/caregiver will recall 3/3 posterior hip precautions and how to maintain during ADL/mobility to maximize safety/independent.  Plan Discharge plan remains appropriate;Frequency remains appropriate    Co-evaluation                 AM-PAC OT "6 Clicks" Daily Activity     Outcome Measure   Help from another person eating meals?: None Help from another person taking care of personal grooming?: A Little Help from another person toileting, which includes using toliet, bedpan, or urinal?: A Little Help from another person bathing (including washing, rinsing, drying)?: A Lot Help from another person to put on and taking off regular upper body clothing?: None Help from another person to put on and taking off regular lower body clothing?: A Lot 6 Click Score: 18    End of Session Equipment Utilized During Treatment: Rolling walker  OT Visit Diagnosis: Other abnormalities of gait and mobility (R26.89);Muscle weakness (generalized) (M62.81);Pain Pain - Right/Left: Right Pain - part of body: Hip   Activity Tolerance Patient tolerated treatment well   Patient Left in chair;with chair alarm set;with call bell/phone within reach   Nurse Communication          Time: 3614-4315 OT Time Calculation (min): 19 min  Charges: OT General Charges $OT Visit: 1 Visit OT Treatments $Self Care/Home Management : 8-22  mins  Dessie Coma, M.S. OTR/L  10/05/19, 3:00 PM

## 2019-10-05 NOTE — Progress Notes (Signed)
Physical Therapy Treatment Patient Details Name: Sarah Shields MRN: [id number] DOB: March 25, 1946 Today's Date: 10/05/2019    History of Present Illness Pt is POD 2 of R THR. History of arthritis.    PT Comments    Pt in bed upon arrival to room and agreeable to PT. Pain was a limiting factor during this morning's session as pt ambulated decreased overall distance today. Pt required min A for RLE management during bed mobility. Pt continues to be CGA-min A for transfers from various surface heights. Ambulated with CGA for safety secondary to antalgic step-to gait. Pt negotiated 4 steps with CGA and able to verbalize back and demo immediate recall of instructions for safe technique. Pt continues to require education and VCs to maintain hip precautions and for hand and foot placement during transfers. Pt cleared by PT standpoint to d/c home with HHPT and 24/7 supervision.  Follow Up Recommendations  Home health PT;Supervision/Assistance - 24 hour     Equipment Recommendations  Other (comment) (youth sized RW)    Recommendations for Other Services       Precautions / Restrictions Precautions Precautions: Posterior Hip;Fall Precaution Booklet Issued: Yes (comment) Precaution Comments: able to recall 2/3 hip precautions; requires education for 3/3 precautions Restrictions Weight Bearing Restrictions: Yes RLE Weight Bearing: Weight bearing as tolerated    Mobility  Bed Mobility Overal bed mobility: Needs Assistance Bed Mobility: Supine to Sit     Supine to sit: Min assist     General bed mobility comments: required min A for RLE management to and over edge of bed due to increased pain   Transfers Overall transfer level: Needs assistance Equipment used: Rolling walker (2 wheeled) Transfers: Sit to/from Stand Sit to Stand: Min assist         General transfer comment: VCs for proper hand and RLE placement to improve safety and efficiency of  transfer  Ambulation/Gait Ambulation/Gait assistance: Min guard Gait Distance (Feet): 80 Feet Assistive device: Rolling walker (2 wheeled) Gait Pattern/deviations: Step-to pattern;Antalgic Gait velocity: 10' in 22"   General Gait Details: Pt required seated rest break between distances and returned to room in recliner chair. Pain is limiting factor.   Stairs Stairs: Yes Stairs assistance: Min guard Stair Management: Two rails;Step to pattern Number of Stairs: 4 General stair comments: required CGA for steadying and safety; VCs for sequencing   Wheelchair Mobility    Modified Rankin (Stroke Patients Only)       Balance Overall balance assessment: Needs assistance Sitting-balance support: Feet supported;Feet unsupported;Bilateral upper extremity supported Sitting balance-Leahy Scale: Good Sitting balance - Comments: VCs for maintaining hip precautions when sitting on BSC   Standing balance support: Bilateral upper extremity supported Standing balance-Leahy Scale: Fair Standing balance comment: BUE support on RW                            Cognition Arousal/Alertness: Awake/alert Behavior During Therapy: WFL for tasks assessed/performed Overall Cognitive Status: Within Functional Limits for tasks assessed                                        Exercises Other Exercises Other Exercises: Bed level therex including quad sets, hip abduction, and SLR x 15 reps. Hip abd and SLR with decreased range of movements and required min-mod A due to increased pain today. Other Exercises: ambulated 3 feet to [facility]  with RW and step-to gait pattern with CGA; tf to/from with min A secondary to increased height of BSC    General Comments        Pertinent Vitals/Pain Pain Assessment: 0-10 Pain Score: 6  Pain Location: R hip Pain Descriptors / Indicators: Discomfort;Operative site guarding;Sore Pain Intervention(s): Limited activity within patient's  tolerance;Monitored during session;Repositioned    Home Living                      Prior Function            PT Goals (current goals can now be found in the care plan section) Acute Rehab PT Goals Patient Stated Goal: to go home PT Goal Formulation: With patient Time For Goal Achievement: 10/17/19 Potential to Achieve Goals: Good Progress towards PT goals: Progressing toward goals    Frequency    BID      PT Plan Current plan remains appropriate    Co-evaluation              AM-PAC PT "6 Clicks" Mobility   Outcome Measure  Help needed turning from your back to your side while in a flat bed without using bedrails?: A Little Help needed moving from lying on your back to sitting on the side of a flat bed without using bedrails?: A Little Help needed moving to and from a bed to a chair (including a wheelchair)?: A Little Help needed standing up from a chair using your arms (e.g., wheelchair or bedside chair)?: A Little Help needed to walk in hospital room?: A Little Help needed climbing 3-5 steps with a railing? : A Little 6 Click Score: 18    End of Session Equipment Utilized During Treatment: Gait belt Activity Tolerance: Patient limited by pain Patient left: in chair;with call bell/phone within reach;with chair alarm set;with SCD's reapplied Nurse Communication: Mobility status PT Visit Diagnosis: Muscle weakness (generalized) (M62.81);Difficulty in walking, not elsewhere classified (R26.2);Pain Pain - Right/Left: Right Pain - part of body: Hip     Time: 0911-0952 PT Time Calculation (min) (ACUTE ONLY): 41 min  Charges:  $Gait Training: 23-37 mins $Therapeutic Exercise: 8-22 mins                     Vale Haven, SPT   [name] 10/05/2019, 12:04 PM

## 2019-10-05 NOTE — Care Management Important Message (Signed)
Important Message  Patient Details  Name: Sarah Shields MRN: [id number] Date of Birth: [birth year]   Medicare Important Message Given:  N/A - LOS <3 / Initial given by admissions     [name] 10/05/2019, 9:09 AM

## 2020-07-26 IMAGING — DX DG HIP (WITH OR WITHOUT PELVIS) 1V PORT*R*
2 series · 2 of 2 positions shown · non-contrast
Comparison: None.

CLINICAL DATA: Status post right hip replacement

EXAM:
DG HIP (WITH OR WITHOUT PELVIS) 1V PORT RIGHT

[pelvis ap]
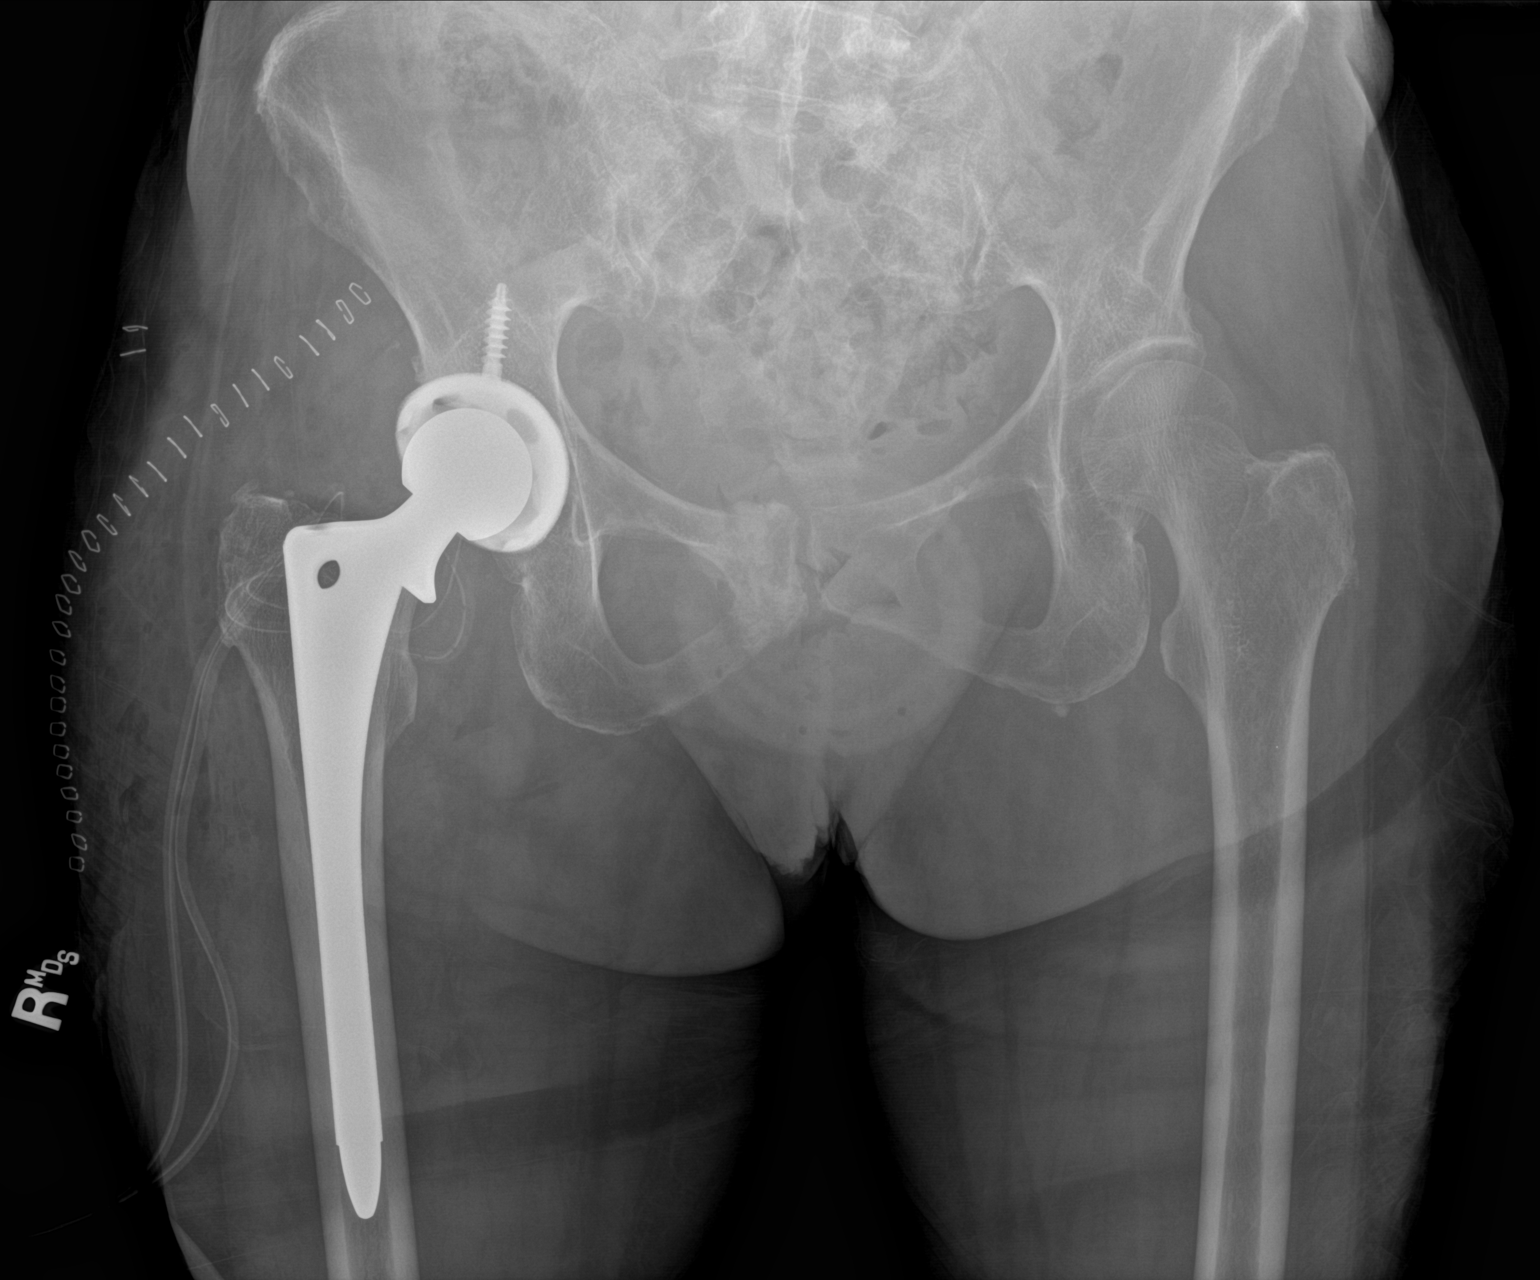

[hip lat]
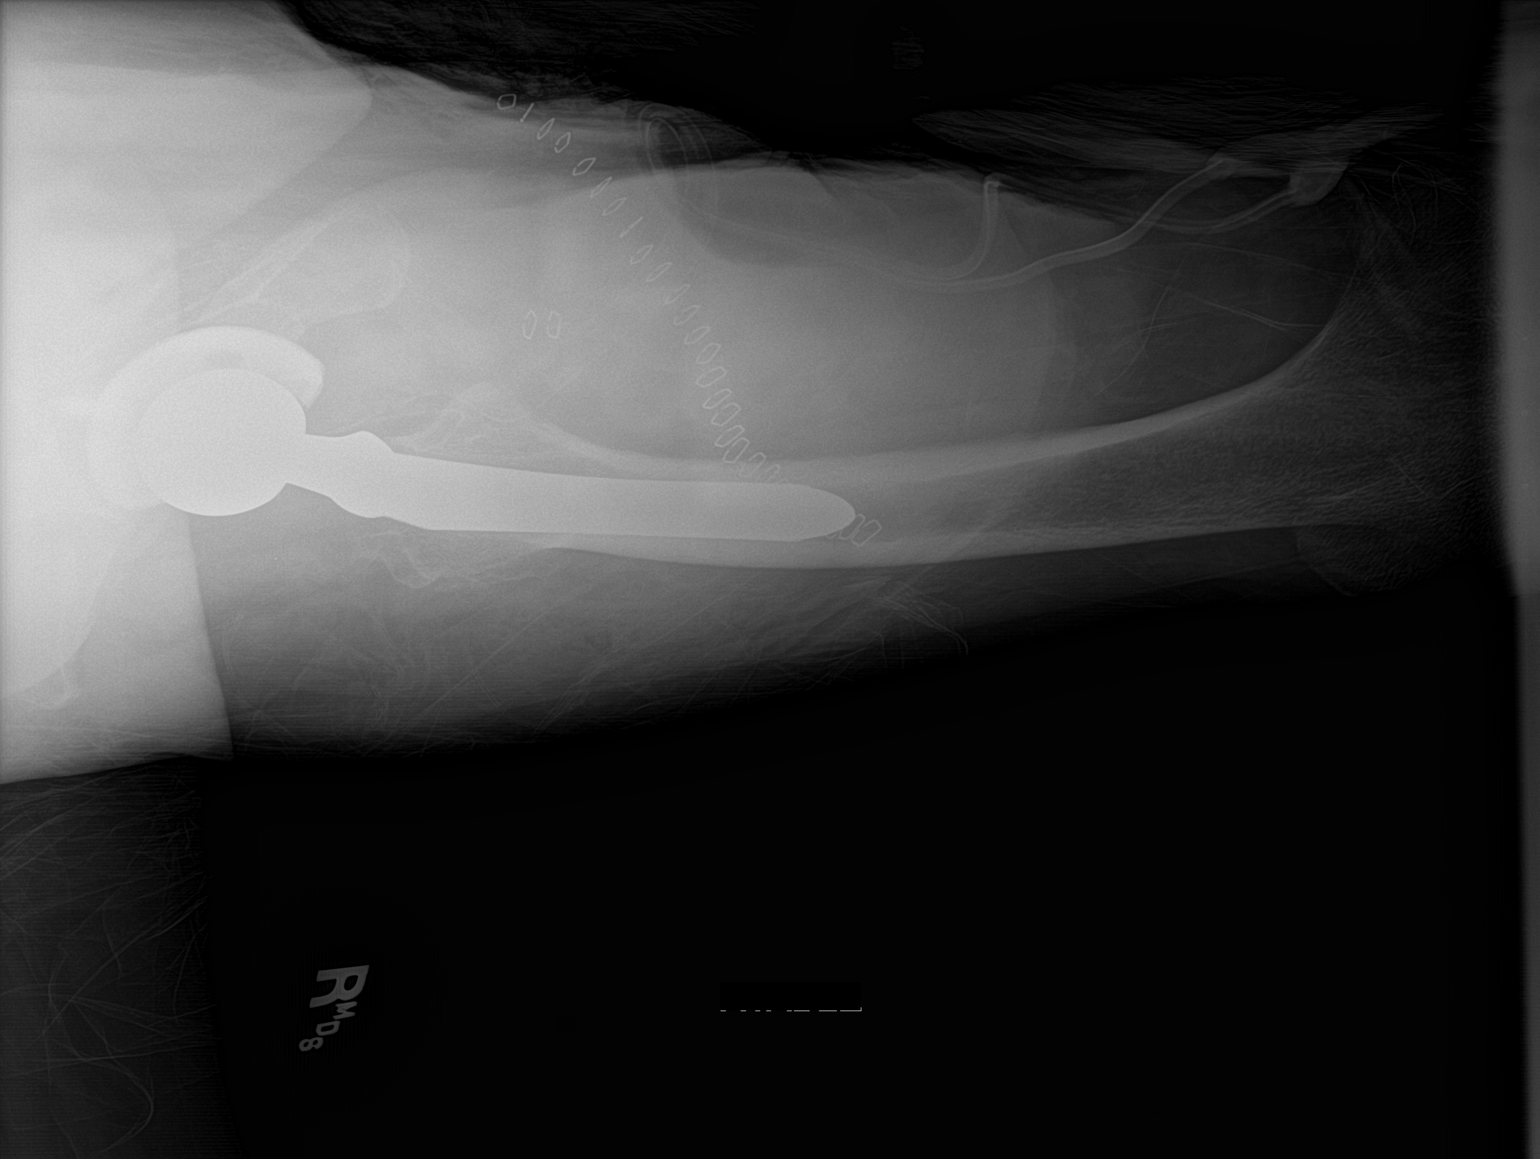

[2 of 2 positions shown; findings below may reference images not displayed]

FINDINGS: Right hip prosthesis is noted in place. Surgical drains are seen. No
acute bony or soft tissue abnormality is noted. Pessary is seen in
place.
IMPRESSION: Status post right hip replacement
